# Patient Record
Sex: Female | Born: 1980 | Race: White | Hispanic: No | Marital: Single | Smoking: Current every day smoker
Health system: Southern US, Community
[De-identification: ages and names within clinical notes are randomized; demographics above are authoritative.]

---

## 2014-08-15 ENCOUNTER — Encounter (HOSPITAL_COMMUNITY): Payer: Self-pay | Admitting: Neurology

## 2014-08-15 ENCOUNTER — Emergency Department (HOSPITAL_COMMUNITY)
Admission: EM | Admit: 2014-08-15 | Discharge: 2014-08-16 | Disposition: A | Discharge status: Home / Self Care | Payer: Medicaid Other | Attending: Emergency Medicine | Admitting: Emergency Medicine

## 2014-08-15 ENCOUNTER — Emergency Department (HOSPITAL_COMMUNITY): Payer: Medicaid Other

## 2014-08-15 DIAGNOSIS — F10120 Alcohol abuse with intoxication, uncomplicated: Secondary | ICD-10-CM | POA: Diagnosis present

## 2014-08-15 DIAGNOSIS — R05 Cough: Secondary | ICD-10-CM | POA: Diagnosis not present

## 2014-08-15 DIAGNOSIS — Z79899 Other long term (current) drug therapy: Secondary | ICD-10-CM | POA: Diagnosis not present

## 2014-08-15 DIAGNOSIS — F10920 Alcohol use, unspecified with intoxication, uncomplicated: Secondary | ICD-10-CM

## 2014-08-15 DIAGNOSIS — R5383 Other fatigue: Secondary | ICD-10-CM | POA: Insufficient documentation

## 2014-08-15 DIAGNOSIS — R059 Cough, unspecified: Secondary | ICD-10-CM

## 2014-08-15 DIAGNOSIS — F1010 Alcohol abuse, uncomplicated: Secondary | ICD-10-CM

## 2014-08-15 DIAGNOSIS — Z3202 Encounter for pregnancy test, result negative: Secondary | ICD-10-CM | POA: Diagnosis not present

## 2014-08-15 LAB — URINALYSIS, ROUTINE W REFLEX MICROSCOPIC
Bilirubin Urine: NEGATIVE
Glucose, UA: NEGATIVE mg/dL
Ketones, ur: NEGATIVE mg/dL
LEUKOCYTES UA: NEGATIVE
Nitrite: NEGATIVE
PH: 6.5 (ref 5.0–8.0)
PROTEIN: 30 mg/dL — AB
Specific Gravity, Urine: 1.006 (ref 1.005–1.030)
UROBILINOGEN UA: 0.2 mg/dL (ref 0.0–1.0)

## 2014-08-15 LAB — CBC WITH DIFFERENTIAL/PLATELET
Basophils Absolute: 0 10*3/uL (ref 0.0–0.1)
Basophils Relative: 1 % (ref 0–1)
Eosinophils Absolute: 0.2 10*3/uL (ref 0.0–0.7)
Eosinophils Relative: 2 % (ref 0–5)
HEMATOCRIT: 45.1 % (ref 36.0–46.0)
Hemoglobin: 15.5 g/dL — ABNORMAL HIGH (ref 12.0–15.0)
Lymphocytes Relative: 39 % (ref 12–46)
Lymphs Abs: 3 10*3/uL (ref 0.7–4.0)
MCH: 37.6 pg — AB (ref 26.0–34.0)
MCHC: 34.4 g/dL (ref 30.0–36.0)
MCV: 109.5 fL — AB (ref 78.0–100.0)
MONO ABS: 0.4 10*3/uL (ref 0.1–1.0)
MONOS PCT: 5 % (ref 3–12)
Neutro Abs: 4.2 10*3/uL (ref 1.7–7.7)
Neutrophils Relative %: 53 % (ref 43–77)
Platelets: 149 10*3/uL — ABNORMAL LOW (ref 150–400)
RBC: 4.12 MIL/uL (ref 3.87–5.11)
RDW: 14.6 % (ref 11.5–15.5)
WBC: 7.8 10*3/uL (ref 4.0–10.5)

## 2014-08-15 LAB — COMPREHENSIVE METABOLIC PANEL
ALK PHOS: 79 U/L (ref 39–117)
ALT: 69 U/L — ABNORMAL HIGH (ref 0–35)
AST: 148 U/L — ABNORMAL HIGH (ref 0–37)
Albumin: 3.5 g/dL (ref 3.5–5.2)
Anion gap: 10 (ref 5–15)
BUN: 7 mg/dL (ref 6–23)
CO2: 28 mmol/L (ref 19–32)
Calcium: 8.2 mg/dL — ABNORMAL LOW (ref 8.4–10.5)
Chloride: 102 mEq/L (ref 96–112)
Creatinine, Ser: 0.75 mg/dL (ref 0.50–1.10)
GFR calc Af Amer: >90 mL/min (ref >90)
GFR calc non Af Amer: >90 mL/min (ref >90)
Glucose, Bld: 96 mg/dL (ref 70–99)
Potassium: 3.3 mmol/L — ABNORMAL LOW (ref 3.5–5.1)
SODIUM: 140 mmol/L (ref 135–145)
Total Bilirubin: 0.9 mg/dL (ref 0.3–1.2)
Total Protein: 6.9 g/dL (ref 6.0–8.3)

## 2014-08-15 LAB — RAPID URINE DRUG SCREEN, HOSP PERFORMED
Amphetamines: NOT DETECTED
BARBITURATES: NOT DETECTED
Benzodiazepines: NOT DETECTED
Cocaine: NOT DETECTED
Opiates: NOT DETECTED
Tetrahydrocannabinol: POSITIVE — AB

## 2014-08-15 LAB — URINE MICROSCOPIC-ADD ON

## 2014-08-15 LAB — SALICYLATE LEVEL

## 2014-08-15 LAB — PREGNANCY, URINE: Preg Test, Ur: NEGATIVE

## 2014-08-15 LAB — ETHANOL: Alcohol, Ethyl (B): 452 mg/dL (ref 0–9)

## 2014-08-15 MED ORDER — SODIUM CHLORIDE 0.9 % IV BOLUS (SEPSIS)
1000.0000 mL | Freq: Once | INTRAVENOUS | Status: AC
  Administered 2014-08-15: 1000 mL via INTRAVENOUS

## 2014-08-15 MED ORDER — VITAMIN B-1 100 MG PO TABS: 100.0000 mg | ORAL_TABLET | Freq: Every day | ORAL | Status: DC

## 2014-08-15 MED ORDER — LORAZEPAM 2 MG/ML IJ SOLN: 0.0000 mg | Freq: Two times a day (BID) | INTRAMUSCULAR | Status: DC

## 2014-08-15 MED ORDER — THIAMINE HCL 100 MG/ML IJ SOLN
100.0000 mg | Freq: Every day | INTRAMUSCULAR | Status: DC
  Administered 2014-08-15: 100 mg via INTRAVENOUS
  Filled 2014-08-15: qty 2

## 2014-08-15 MED ORDER — LORAZEPAM 2 MG/ML IJ SOLN
0.0000 mg | Freq: Four times a day (QID) | INTRAMUSCULAR | Status: DC
  Administered 2014-08-16: 1 mg via INTRAVENOUS
  Filled 2014-08-15: qty 1

## 2014-08-15 NOTE — ED Notes (Signed)
Pt states she has drink 1 fifth of vodka today, this is more than her normal. Family states she is planning on going to rehab for this and "she is going out with a big bang." Pt falling asleep in mid sentence. Pt voice is very raspy.

## 2014-08-15 NOTE — ED Notes (Signed)
CRITICAL VALUE ALERT  Critical value received:  ETOH 452  Date of notification:  08/15/14  Time of notification: 1950

## 2014-08-15 NOTE — ED Notes (Signed)
Pt reports here for rehab for ETOH. Pt appears very intoxicated, falling asleep in chair, unable to stay awake for triage.

## 2014-08-15 NOTE — ED Provider Notes (Signed)
CSN: [id number]     Arrival date & time 08/15/14  1736 History   First MD Initiated Contact with Patient 08/15/14 1747     Chief Complaint  Patient presents with  . Alcohol Intoxication   HPI  Patient is a 34 year old female who presents emergency room for evaluation of acute alcohol intoxication and desire for alcohol detoxification. Patient has been drinking near daily for the past 2 years. She drinks up to 1/5 of the vanilla flavored vodka per day. She drank one fifth today. She denies any physical symptoms at this time. She is acutely intoxicated and cannot stay awake during interview. Her best friend reports that she has also had a dry hacking cough with some intermittent yellow sputum production. She is also been very congested for the past 2 weeks. Patient did go and speak with the [facility] who recommended that she come here. Patient has never attempted to detox before. Patient does not have family in the area other than her daughter who is being taken care of by her best friend.  History reviewed. No pertinent past medical history. History reviewed. No pertinent past surgical history. No family history on file. History  Substance Use Topics  . Smoking status: Not on file  . Smokeless tobacco: Not on file  . Alcohol Use: Yes   OB History    No data available     Review of Systems Level 5 caveat alcohol intoxication   Allergies  Review of patient's allergies indicates no known allergies.  Home Medications   Prior to Admission medications   Medication Sig Start Date End Date Taking? Authorizing Provider  DULoxetine (CYMBALTA) 60 MG capsule Take 60 mg by mouth daily.   Yes Historical Provider, MD  furosemide (LASIX) 20 MG tablet Take 20 mg by mouth daily.    Yes Historical Provider, MD  gabapentin (NEURONTIN) 100 MG capsule Take 100 mg by mouth 2 (two) times daily.   Yes Historical Provider, MD  HYDROcodone-acetaminophen (NORCO) 10-325 MG per tablet Take 1 tablet by mouth 2  (two) times daily.    Yes Historical Provider, MD  PRESCRIPTION MEDICATION Take 1 tablet by mouth daily. BP med?   Yes Historical Provider, MD   BP 93/66 mmHg  Pulse 68  Temp(Src) 98.6 F (37 C) (Oral)  Resp 20  SpO2 92%  LMP  (LMP Unknown) Physical Exam  Constitutional: She appears well-developed and well-nourished. She appears lethargic. No distress.  HENT:  Head: Normocephalic and atraumatic.  Mouth/Throat: Oropharynx is clear and moist. No oropharyngeal exudate.  Eyes: Conjunctivae and EOM are normal. Pupils are equal, round, and reactive to light. No scleral icterus.  Neck: Normal range of motion. Neck supple. No JVD present. No thyromegaly present.  Cardiovascular: Normal rate, regular rhythm, normal heart sounds and intact distal pulses.  Exam reveals no gallop and no friction rub.   No murmur heard. Pulmonary/Chest: Effort normal and breath sounds normal. No respiratory distress. She has no wheezes. She has no rales. She exhibits no tenderness.  Abdominal: Soft. Bowel sounds are normal. She exhibits no distension and no mass. There is no tenderness. There is no rebound and no guarding.  Musculoskeletal: Normal range of motion.  Lymphadenopathy:    She has no cervical adenopathy.  Neurological: She has normal strength. She appears lethargic. No cranial nerve deficit or sensory deficit.  Patient lethargic but will wake up with sternal rub and voice commands. Patient follows commands appropriately. She was all extremities. She is oriented to person and  place.  Skin: Skin is warm and dry. She is not diaphoretic.  Nursing note and vitals reviewed.   ED Course  Procedures (including critical care time) Labs Review Labs Reviewed  CBC WITH DIFFERENTIAL - Abnormal; Notable for the following:    Hemoglobin 15.5 (*)    MCV 109.5 (*)    MCH 37.6 (*)    Platelets 149 (*)    All other components within normal limits  COMPREHENSIVE METABOLIC PANEL - Abnormal; Notable for the  following:    Potassium 3.3 (*)    Calcium 8.2 (*)    AST 148 (*)    ALT 69 (*)    All other components within normal limits  ETHANOL - Abnormal; Notable for the following:    Alcohol, Ethyl (B) 452 (*)    All other components within normal limits  URINE RAPID DRUG SCREEN (HOSP PERFORMED) - Abnormal; Notable for the following:    Tetrahydrocannabinol POSITIVE (*)    All other components within normal limits  URINALYSIS, ROUTINE W REFLEX MICROSCOPIC - Abnormal; Notable for the following:    Hgb urine dipstick SMALL (*)    Protein, ur 30 (*)    All other components within normal limits  SALICYLATE LEVEL  PREGNANCY, URINE  URINE MICROSCOPIC-ADD ON    Imaging Review Dg Chest 2 View  08/15/2014   CLINICAL DATA:  Cough and weakness for 3 days  EXAM: CHEST  2 VIEW  COMPARISON:  None.  FINDINGS: Cardiac shadow is mildly enlarged. The lungs are well aerated bilaterally. No focal infiltrate or sizable effusion is seen. Mild increased perihilar markings are noted. This may be related to mild vascular congestion or underlying bronchitic changes. No acute bony abnormality is noted.  IMPRESSION: Mild increased central markings as described. No focal confluent infiltrate is seen.   Electronically Signed   By: Alcide CleverMark  Lukens M.D.   On: 08/15/2014 19:49     EKG Interpretation None      MDM   Final diagnoses:  Cough  Alcohol abuse  Alcohol intoxication, uncomplicated   Patient is a 34 year old female who presents emergency room for acute alcohol intoxication. Her friend reports that she has a desire to detox. Patient is able to be aroused with verbal commands, and will follow verbal commands. She is protecting her airway. Vital signs are stable. CBC reveals macrocytic red blood cells with no acute anemia. CMP reveals mild hypokalemia which have been replaced here. Salicylate is negative. Urine drug screen is positive only for THC. Urine pregnancy is negative. Alcohol level is 453. Patient is  acutely intoxicated. Patient given normal saline bolus. She has been placed on CIWA protocol. We'll start her on regular diet as she sobers up. We'll reevaluate desire for alcohol detoxification when she is sober. Patient to be held until she has sobered up and can hold a coherent conversation and walk with stable gait. Patient to be signed out with Dr. Micheline Mazeocherty.    Eben Burowourtney A Forcucci, PA-C 08/16/14 [id number]  Toy CookeyMegan Docherty, MD 08/16/14 [id number]

## 2014-08-16 MED ORDER — CHLORDIAZEPOXIDE HCL 25 MG PO CAPS: 50.0000 mg | ORAL_CAPSULE | Freq: Three times a day (TID) | ORAL | Status: DC | PRN

## 2014-08-16 NOTE — Discharge Instructions (Signed)
Alcohol Withdrawal °Alcohol withdrawal happens when you normally drink alcohol a lot and suddenly stop drinking. Alcohol withdrawal symptoms can be mild to very bad. Mild withdrawal symptoms can include feeling sick to your stomach (nauseous), headache, or feeling irritable. Bad withdrawal symptoms can include shakiness, being very nervous (anxious), and not thinking clearly.  °HOME CARE °· Join an alcohol support group. °· Stay away from people or situations that make you want to drink. °· Eat a healthy diet. Eat a lot of fresh fruits, vegetables, and lean meats. °GET HELP RIGHT AWAY IF:  °· You become confused. You start to see and hear things that are not really there. °· You feel your heart beating very fast. °· You throw up (vomit) blood or cannot stop throwing up. This may be bright red or look like black coffee grounds. °· You have blood in your poop (stool). This may be bright red, maroon colored, or black and tarry. °· You are lightheaded or pass out (faint). °· You develop a fever. °MAKE SURE YOU:  °· Understand these instructions. °· Will watch your condition. °· Will get help right away if you are not doing well or get worse. °Document Released: 12/31/2007 Document Revised: 10/06/2011 Document Reviewed: 12/31/2007 °ExitCare® Patient Information ©2015 [employer]. This information is not intended to replace advice given to you by your health care provider. Make sure you discuss any questions you have with your health care provider. ° °Alcohol Use Disorder °Alcohol use disorder is a mental disorder. It is not a one-time incident of heavy drinking. Alcohol use disorder is the excessive and uncontrollable use of alcohol over time that leads to problems with functioning in one or more areas of daily living. People with this disorder risk harming themselves and others when they drink to excess. Alcohol use disorder also can cause other mental disorders, such as mood and anxiety disorders, and serious  physical problems. People with alcohol use disorder often misuse other drugs.  °Alcohol use disorder is common and widespread. Some people with this disorder drink alcohol to cope with or escape from negative life events. Others drink to relieve chronic pain or symptoms of mental illness. People with a family history of alcohol use disorder are at higher risk of losing control and using alcohol to excess.  °SYMPTOMS  °Signs and symptoms of alcohol use disorder may include the following:  °· Consumption of alcohol in larger amounts or over a longer period of time than intended. °· Multiple unsuccessful attempts to cut down or control alcohol use.   °· A great deal of time spent obtaining alcohol, using alcohol, or recovering from the effects of alcohol (hangover). °· A strong desire or urge to use alcohol (cravings).   °· Continued use of alcohol despite problems at work, school, or home because of alcohol use.   °· Continued use of alcohol despite problems in relationships because of alcohol use. °· Continued use of alcohol in situations when it is physically hazardous, such as driving a car. °· Continued use of alcohol despite awareness of a physical or psychological problem that is likely related to alcohol use. Physical problems related to alcohol use can involve the brain, heart, liver, stomach, and intestines. Psychological problems related to alcohol use include intoxication, depression, anxiety, psychosis, delirium, and dementia.   °· The need for increased amounts of alcohol to achieve the same desired effect, or a decreased effect from the consumption of the same amount of alcohol (tolerance). °· Withdrawal symptoms upon reducing or stopping alcohol use, or alcohol use   to reduce or avoid withdrawal symptoms. Withdrawal symptoms include: °¨ Racing heart. °¨ Hand tremor. °¨ Difficulty sleeping. °¨ Nausea. °¨ Vomiting. °¨ Hallucinations. °¨ Restlessness. °¨ Seizures. °DIAGNOSIS °Alcohol use disorder is  diagnosed through an assessment by your health care provider. Your health care provider may start by asking three or four questions to screen for excessive or problematic alcohol use. To confirm a diagnosis of alcohol use disorder, at least two symptoms must be present within a 12-month period. The severity of alcohol use disorder depends on the number of symptoms: °· Mild--two or three. °· Moderate--four or five. °· Severe--six or more. °Your health care provider may perform a physical exam or use results from lab tests to see if you have physical problems resulting from alcohol use. Your health care provider may refer you to a mental health professional for evaluation. °TREATMENT  °Some people with alcohol use disorder are able to reduce their alcohol use to low-risk levels. Some people with alcohol use disorder need to quit drinking alcohol. When necessary, mental health professionals with specialized training in substance use treatment can help. Your health care provider can help you decide how severe your alcohol use disorder is and what type of treatment you need. The following forms of treatment are available:  °· Detoxification. Detoxification involves the use of prescription medicines to prevent alcohol withdrawal symptoms in the first week after quitting. This is important for people with a history of symptoms of withdrawal and for heavy drinkers who are likely to have withdrawal symptoms. Alcohol withdrawal can be dangerous and, in severe cases, cause death. Detoxification is usually provided in a hospital or in-patient substance use treatment facility. °· Counseling or talk therapy. Talk therapy is provided by substance use treatment counselors. It addresses the reasons people use alcohol and ways to keep them from drinking again. The goals of talk therapy are to help people with alcohol use disorder find healthy activities and ways to cope with life stress, to identify and avoid triggers for alcohol  use, and to handle cravings, which can cause relapse. °· Medicines. Different medicines can help treat alcohol use disorder through the following actions: °¨ Decrease alcohol cravings. °¨ Decrease the positive reward response felt from alcohol use. °¨ Produce an uncomfortable physical reaction when alcohol is used (aversion therapy). °· Support groups. Support groups are run by people who have quit drinking. They provide emotional support, advice, and guidance. °These forms of treatment are often combined. Some people with alcohol use disorder benefit from intensive combination treatment provided by specialized substance use treatment centers. Both inpatient and outpatient treatment programs are available. °Document Released: 08/21/2004 Document Revised: 11/28/2013 Document Reviewed: 10/21/2012 °ExitCare® Patient Information ©2015 [employer]. This information is not intended to replace advice given to you by your health care provider. Make sure you discuss any questions you have with your health care provider. ° ° ° °Emergency Department Resource Guide °1) Find a Doctor and Pay Out of Pocket °Although you won't have to find out who is covered by your insurance plan, it is a good idea to ask around and get recommendations. You will then need to call the office and see if the doctor you have chosen will accept you as a new patient and what types of options they offer for patients who are self-pay. Some doctors offer discounts or will set up payment plans for their patients who do not have insurance, but you will need to ask so you aren't surprised when you get to your   appointment. ° °2) Contact Your Local Health Department °Not all health departments have doctors that can see patients for sick visits, but many do, so it is worth a call to see if yours does. If you don't know where your local health department is, you can check in your phone book. The CDC also has a tool to help you locate your state's health  department, and many state websites also have listings of all of their local health departments. ° °3) Find a Walk-in Clinic °If your illness is not likely to be very severe or complicated, you may want to try a walk in clinic. These are popping up all over the country in pharmacies, drugstores, and shopping centers. They're usually staffed by nurse practitioners or physician assistants that have been trained to treat common illnesses and complaints. They're usually fairly quick and inexpensive. However, if you have serious medical issues or chronic medical problems, these are probably not your best option. ° °No Primary Care Doctor: °- Call [facility] at  832-8000 - they can help you locate a primary care doctor that  accepts your insurance, provides certain services, etc. °- Physician Referral Service- 1-800-533-3463 ° °Chronic Pain Problems: °Organization         Address  Phone   Notes  °[facility]  (336) 297-2271 Patients need to be referred by their primary care doctor.  ° °Medication Assistance: °Organization         Address  Phone   Notes  °[place] [facility] [address] °[place], [place] [address] (336) 641-8030 --Must be a resident of [place] °-- Must have NO insurance coverage whatsoever (no Medicaid/ Medicare, etc.) °-- The pt. MUST have a primary care doctor that directs their care regularly and follows them in the community °  °[facility]  (866) 331-1348   °[employer]  (888) 892-1162   ° °Agencies that provide inexpensive medical care: °Organization         Address  Phone   Notes  °[facility]  (336) 832-8035   ° Internal Medicine    (336) 832-7272   °[facility] [address] °[place], [place] [address] (336) 832-4777   °[facility] [address], °[place] (336) 271-4999   °[facility]    (336) 373-0678   °[facility]    (336) 272-1050    °[facility] ° [address], [place] Phone:  (336) 832-4444, Fax:  (336) 832-4440 Hours of Operation:  9 am - 6 pm, M-F.  Also accepts Medicaid/Medicare and self-pay.  °[facility] ° [address], [place] Phone: (336) 832-3150, Fax: (336) [phone]. Hours of Operation:  8:30 am - 5:30 pm, M-F.  Also accepts Medicaid and self-pay.  °[facility] [place] [address], [place] Phone: (336) 878-6027   °[facility] [address], [place], [place] (336)723-1848, Ext. 123 Mondays & Thursdays: 7-9 AM.  First 15 patients are seen on a first come, first serve basis. °  ° °Medicaid-accepting [place] Providers: ° °Organization         Address  Phone   Notes  °[facility] [address], [place] (336) 641-2100 Also accepts self-pay patients.  °[facility] [address],  ° (336) 856-9996   °[facility] [address], [place] (336) 288-8857   °[facility] [address] [place] [address], [place] (336) 299-7000   °Veita Bland [address], [place]  ° (336) 373-1557 Only accepts [place] Access Medicaid patients after they have their name applied to their card.  ° °Self-Pay (no insurance) in [place]: ° °Organization         Address  Phone   Notes  °Sickle Cell Patients, [facility] [address], [place] (336) 832-1970   °[facility] [address], [place] (336) 832-4400   °[facility] ° [address], [place] (336) 992-4800   °[facility]/Dr. Osei-Bonsu ° [address] [place] [address], [place] or [address], [place] (336) [phone] Phone number for both [place] and [place] locations is the same.  °[facility] [address], [place] (336) 299-0000    °[facility] [address] [place] [address], [place] or [address] (336) 852-7530 °(336) 878-2260   °[facility] [address], [place] (336) 350-1642, phone; (336) 294-5005, fax Sees patients 1st and 3rd Saturday of every month.  Must not qualify for public or private insurance (i.e. Medicaid, Medicare, [place] Health Choice, Veterans' Benefits) • Household income should be no more than 200% of the poverty level •The clinic cannot treat you if you are pregnant or think you are pregnant • Sexually transmitted diseases are not treated at the clinic.  ° ° °Dental Care: °Organization         Address  Phone  Notes  °[facility] [address], [place] (336) 641-6152 Accepts children up to age 21 who are enrolled in Medicaid or [place] Health Choice; pregnant women with a Medicaid card; and children who have applied for Medicaid or [place] Health Choice, but were declined, whose parents can pay a reduced fee at time of service.  °[facility] [place]  [address], [place] (336) 641-7733 Accepts children up to age 21 who are enrolled in Medicaid or [place] Health Choice; pregnant women with a Medicaid card; and children who have applied for Medicaid or [place] Health Choice, but were declined, whose parents can pay a reduced fee at time of service.  °[facility] ° [address], [place] (336) 641-4533 Patients are seen by appointment only. Walk-ins are not accepted. [facility] will see patients 18 years of age and older. °Monday - Tuesday (8am-5pm) °Most Wednesdays (8:30-5pm) °$30 per visit, cash only  °[facility] ° [address], [place] (336) 641-4533 Patients are seen by appointment only. Walk-ins are not accepted. [facility] will see patients 18 years of age and older. °One Wednesday Evening (Monthly: Volunteer Based).   $30 per visit, cash only  °[facility]  (919) [phone] for adults; Children under age 4, call Graduate Pediatric Dentistry at ([phone]) [phone]. Children aged 4-14, please call ([phone]) [phone] to request a pediatric application. ° Dental services are provided in all areas of dental care including fillings, crowns and bridges, complete and partial dentures, implants, gum treatment, root canals, and extractions. Preventive care is also provided. Treatment is provided to both adults and children. °Patients are selected via a lottery and there is often a waiting list. °  °[facility] [address], °[place] ° (336) 763-8833 [email or web address] °  °[facility] [address], [place], [place] (336)723-1848, Ext. 123 Second and Fourth Thursday of each month, opens at 6:30 AM; Clinic ends at 9 AM.  Patients are seen on a first-come first-served basis, and a limited number are seen during each clinic.  ° °[facility] ° [address], [place], [place] (336) 723-7904   Eligibility Requirements °You must have lived in [place], [place], or [place] counties for at least the last three months. °  You cannot be eligible for state or federal sponsored healthcare insurance, including Veterans Administration, Medicaid, or Medicare. °  You generally cannot be eligible for healthcare insurance through your employer.  °  How to apply: °Eligibility screenings are held every Tuesday and Wednesday afternoon from 1:00 pm until 4:00 pm. You do not need an appointment for the interview!  °[facility] [address], [place], [place] 336-631-2330   °[facility]  336-342-8273   °[facility]  336-703-3100   °[facility]  336-570-6415   ° °Behavioral Health Resources in the Community: °Intensive Outpatient Programs °Organization         Address  Phone  Notes  °[facility] [address], [place], [place] 336-878-6098   °[facility] [address], [place], [place] 336-832-9800   °[facility] [address], [place], [place] ° 336-882-2125   °[facility] [address],  °[place], [place] 1-800-853-5163 or 336-641-4981   °Substance Abuse Resources °Organization         Address  Phone  Notes  °[facility]  336-882-2125   °[facility]  336-784-9470   °[facility] [place] [facility]  336-285-9073   °[facility]  336-845-3988   °Residential & Outpatient Substance Abuse Program  1-800-659-3381   °Psychological Services °Organization         Address  Phone  Notes  °[facility]  336- 832-9600   °[facility]  336- 378-7881   °[facility] [address], [place] 1-800-853-5163 or 336-641-4981   ° °Mobile Crisis Teams °Organization         Address  Phone  Notes  °[facility], Mobile Crisis Care Unit  1-877-626-1772   °Assertive °[facility] ° [address] [place], [place] 336-834-9664   °Sharon DeEsch [address] °[place] 336-554-5454   ° °Self-Help/Support Groups °Organization         Address  Phone             Notes  °[facility] - variety of support groups  336- 373-1402 Call for more information  °Narcotics Anonymous (NA), [facility] [address], °[place]  2 meetings at this location  ° °Residential Treatment Programs °Organization         Address  Phone  Notes  °[facility] [address],    °[place]  1-866-801-8205   °[facility] ° [address], [place], [place] 704-293-8524   °[facility] [address], [place] 336-845-3988 Admissions: 8am-3pm M-F  °[facility] [address]    °[place], [place] 336-841-1104   °[facility] [address], [place],  336-379-7146   °[facility] [place]  [facility] [address]  °[place],  336-285-9073   °  [facility] - Intensive Outpatient [address], [place], [place] 336-852-3033   °[facility] [address]  °[place], [place] 1-877-615-2722 or 336-784-9470   °[facility] [address] [place], [place] 336-227-7417 Accepts Medicaid  °[facility] [address]  °[place] 1-800-659-3381 Substance Abuse/Addiction Treatment  ° °[place] Behavioral Health Resources °Organization         Address  Phone  Notes  °[facility]  (888) 581-9988   °Julie Brannon, PhD [address] [place], [place]   (336) 349-5553 or (336) 951-0000   °[facility]   [address] °[place], [place] (336) 349-4454   °[facility] [address], [place], [place] (336) 342-8316 Insurance/Medicaid/sponsorship through [facility]  °[facility] [address]                                    [place], [place] (336) 342-8316 Therapy/tele-psych/case  °[facility] [address]  ° [place], [place] (336) 349-2233    °Dr. Arfeen  (336) 349-4544   °[facility] [place]  [employer] [place] [facility] 1) [address],  °2) [address], [place] °3)  [address], [place] (336) 349-3220 °(336) 342-7768 ° °(336) 342-8140   °[place] [facility] (336) 342-1394 or (336) 342-3537 (After Hours)    ° ° ° °

## 2014-08-16 NOTE — ED Notes (Signed)
Clean pt.up changed linen.place into clean gown.gave warm blankets.

## 2014-08-27 LAB — CBC
HCT: 43.5 % (ref 35.0–47.0)
HGB: 14.5 g/dL (ref 12.0–16.0)
MCH: 37.1 pg — ABNORMAL HIGH (ref 26.0–34.0)
MCHC: 33.4 g/dL (ref 32.0–36.0)
MCV: 111 fL — ABNORMAL HIGH (ref 80–100)
PLATELETS: 142 10*3/uL — AB (ref 150–440)
RBC: 3.91 10*6/uL (ref 3.80–5.20)
RDW: 16.7 % — ABNORMAL HIGH (ref 11.5–14.5)
WBC: 7.5 10*3/uL (ref 3.6–11.0)

## 2014-08-27 LAB — COMPREHENSIVE METABOLIC PANEL
ALK PHOS: 89 U/L (ref 46–116)
ALT: 117 U/L — AB (ref 14–63)
AST: 213 U/L — AB (ref 15–37)
Albumin: 3.2 g/dL — ABNORMAL LOW (ref 3.4–5.0)
Anion Gap: 16 (ref 7–16)
BUN: 27 mg/dL — ABNORMAL HIGH (ref 7–18)
Bilirubin,Total: 1.1 mg/dL — ABNORMAL HIGH (ref 0.2–1.0)
CALCIUM: 7.6 mg/dL — AB (ref 8.5–10.1)
CO2: 26 mmol/L (ref 21–32)
CREATININE: 2.36 mg/dL — AB (ref 0.60–1.30)
Chloride: 90 mmol/L — ABNORMAL LOW (ref 98–107)
GFR CALC AF AMER: 31 — AB
GFR CALC NON AF AMER: 25 — AB
GLUCOSE: 74 mg/dL (ref 65–99)
Osmolality: 268 (ref 275–301)
Potassium: 3 mmol/L — ABNORMAL LOW (ref 3.5–5.1)
Sodium: 132 mmol/L — ABNORMAL LOW (ref 136–145)
Total Protein: 6.8 g/dL (ref 6.4–8.2)

## 2014-08-27 LAB — ETHANOL: ETHANOL LVL: 440 mg/dL — AB

## 2014-08-27 LAB — ACETAMINOPHEN LEVEL: Acetaminophen: <2 ug/mL

## 2014-08-27 LAB — SALICYLATE LEVEL: Salicylates, Serum: 2.6 mg/dL

## 2014-08-28 LAB — CBC WITH DIFFERENTIAL/PLATELET
Basophil #: 0 10*3/uL (ref 0.0–0.1)
Basophil %: 1.3 %
EOS ABS: 0 10*3/uL (ref 0.0–0.7)
Eosinophil %: 0.3 %
HCT: 42.7 % (ref 35.0–47.0)
HGB: 14.4 g/dL (ref 12.0–16.0)
LYMPHS ABS: 0.8 10*3/uL — AB (ref 1.0–3.6)
Lymphocyte %: 22.7 %
MCH: 37.8 pg — ABNORMAL HIGH (ref 26.0–34.0)
MCHC: 33.9 g/dL (ref 32.0–36.0)
MCV: 112 fL — AB (ref 80–100)
MONO ABS: 0.4 x10 3/mm (ref 0.2–0.9)
Monocyte %: 13.4 %
Neutrophil #: 2.1 10*3/uL (ref 1.4–6.5)
Neutrophil %: 62.3 %
PLATELETS: 103 10*3/uL — AB (ref 150–440)
RBC: 3.82 10*6/uL (ref 3.80–5.20)
RDW: 17 % — AB (ref 11.5–14.5)
WBC: 3.3 10*3/uL — ABNORMAL LOW (ref 3.6–11.0)

## 2014-08-28 LAB — BASIC METABOLIC PANEL
Anion Gap: 16 (ref 7–16)
Anion Gap: 10 (ref 7–16)
BUN: 28 mg/dL — ABNORMAL HIGH (ref 7–18)
BUN: 29 mg/dL — ABNORMAL HIGH (ref 7–18)
CALCIUM: 7.8 mg/dL — AB (ref 8.5–10.1)
CHLORIDE: 94 mmol/L — AB (ref 98–107)
CHLORIDE: 92 mmol/L — AB (ref 98–107)
CREATININE: 1.19 mg/dL (ref 0.60–1.30)
Calcium, Total: 8.7 mg/dL (ref 8.5–10.1)
Co2: 27 mmol/L (ref 21–32)
Co2: 30 mmol/L (ref 21–32)
Creatinine: 1.17 mg/dL (ref 0.60–1.30)
EGFR (African American): >60
EGFR (Non-African Amer.): 57 — ABNORMAL LOW
EGFR (Non-African Amer.): 56 — ABNORMAL LOW
Glucose: 64 mg/dL — ABNORMAL LOW (ref 65–99)
Glucose: 138 mg/dL — ABNORMAL HIGH (ref 65–99)
OSMOLALITY: 277 (ref 275–301)
Osmolality: 273 (ref 275–301)
POTASSIUM: 3.2 mmol/L — AB (ref 3.5–5.1)
Potassium: 3.3 mmol/L — ABNORMAL LOW (ref 3.5–5.1)
SODIUM: 137 mmol/L (ref 136–145)
SODIUM: 132 mmol/L — AB (ref 136–145)

## 2014-08-29 ENCOUNTER — Inpatient Hospital Stay: Payer: Self-pay | Admitting: Internal Medicine

## 2014-08-29 LAB — CBC WITH DIFFERENTIAL/PLATELET
BASOS ABS: 0 10*3/uL (ref 0.0–0.1)
BASOS PCT: 0.7 %
Eosinophil #: 0.1 10*3/uL (ref 0.0–0.7)
Eosinophil %: 1.3 %
HCT: 43 % (ref 35.0–47.0)
HGB: 14.2 g/dL (ref 12.0–16.0)
Lymphocyte #: 1.1 10*3/uL (ref 1.0–3.6)
Lymphocyte %: 21.5 %
MCH: 37.4 pg — AB (ref 26.0–34.0)
MCHC: 33 g/dL (ref 32.0–36.0)
MCV: 113 fL — ABNORMAL HIGH (ref 80–100)
Monocyte #: 0.4 x10 3/mm (ref 0.2–0.9)
Monocyte %: 7.9 %
NEUTROS ABS: 3.5 10*3/uL (ref 1.4–6.5)
Neutrophil %: 68.6 %
Platelet: 96 10*3/uL — ABNORMAL LOW (ref 150–440)
RBC: 3.79 10*6/uL — ABNORMAL LOW (ref 3.80–5.20)
RDW: 16.7 % — ABNORMAL HIGH (ref 11.5–14.5)
WBC: 5.1 10*3/uL (ref 3.6–11.0)

## 2014-08-29 LAB — D-DIMER(ARMC): D-Dimer: 4032 ng/ml

## 2014-08-29 LAB — BASIC METABOLIC PANEL
Anion Gap: 12 (ref 7–16)
BUN: 22 mg/dL — ABNORMAL HIGH (ref 7–18)
CALCIUM: 8.1 mg/dL — AB (ref 8.5–10.1)
Chloride: 94 mmol/L — ABNORMAL LOW (ref 98–107)
Co2: 27 mmol/L (ref 21–32)
Creatinine: 0.88 mg/dL (ref 0.60–1.30)
EGFR (Non-African Amer.): >60
Glucose: 116 mg/dL — ABNORMAL HIGH (ref 65–99)
Osmolality: 271 (ref 275–301)
Potassium: 3 mmol/L — ABNORMAL LOW (ref 3.5–5.1)
Sodium: 133 mmol/L — ABNORMAL LOW (ref 136–145)

## 2014-08-29 LAB — RAPID HIV SCREEN (HIV 1/2 AB+AG)

## 2014-08-29 LAB — MAGNESIUM: MAGNESIUM: 1.9 mg/dL

## 2014-08-29 LAB — TROPONIN I: Troponin-I: <0.02 ng/mL

## 2014-08-29 LAB — ETHANOL

## 2014-08-29 LAB — PROTIME-INR
INR: 0.9
Prothrombin Time: 12.2 secs

## 2014-08-29 LAB — CLOSTRIDIUM DIFFICILE(ARMC)

## 2014-08-29 LAB — APTT: Activated PTT: 30.2 secs (ref 23.6–35.9)

## 2014-08-30 LAB — BASIC METABOLIC PANEL
ANION GAP: 7 (ref 7–16)
BUN: 8 mg/dL (ref 7–18)
Calcium, Total: 8.7 mg/dL (ref 8.5–10.1)
Chloride: 97 mmol/L — ABNORMAL LOW (ref 98–107)
Co2: 34 mmol/L — ABNORMAL HIGH (ref 21–32)
Creatinine: 0.72 mg/dL (ref 0.60–1.30)
EGFR (African American): >60
EGFR (Non-African Amer.): >60
Glucose: 90 mg/dL (ref 65–99)
OSMOLALITY: 274 (ref 275–301)
Potassium: 3.2 mmol/L — ABNORMAL LOW (ref 3.5–5.1)
Sodium: 138 mmol/L (ref 136–145)

## 2014-08-30 LAB — CBC WITH DIFFERENTIAL/PLATELET
BASOS ABS: 0 10*3/uL (ref 0.0–0.1)
Basophil %: 0.7 %
EOS PCT: 1.8 %
Eosinophil #: 0.1 10*3/uL (ref 0.0–0.7)
HCT: 39.2 % (ref 35.0–47.0)
HGB: 13 g/dL (ref 12.0–16.0)
LYMPHS PCT: 22.5 %
Lymphocyte #: 1.1 10*3/uL (ref 1.0–3.6)
MCH: 37.5 pg — AB (ref 26.0–34.0)
MCHC: 33.3 g/dL (ref 32.0–36.0)
MCV: 113 fL — ABNORMAL HIGH (ref 80–100)
MONOS PCT: 9.2 %
Monocyte #: 0.5 x10 3/mm (ref 0.2–0.9)
NEUTROS PCT: 65.8 %
Neutrophil #: 3.3 10*3/uL (ref 1.4–6.5)
Platelet: 86 10*3/uL — ABNORMAL LOW (ref 150–440)
RBC: 3.48 10*6/uL — ABNORMAL LOW (ref 3.80–5.20)
RDW: 16.5 % — AB (ref 11.5–14.5)
WBC: 5 10*3/uL (ref 3.6–11.0)

## 2014-08-30 LAB — T4, FREE: Free Thyroxine: 1.22 ng/dL (ref 0.76–1.46)

## 2014-08-30 LAB — HEMOGLOBIN A1C: HEMOGLOBIN A1C: 5.5 % (ref 4.2–6.3)

## 2014-08-30 LAB — TSH: Thyroid Stimulating Horm: 5.35 u[IU]/mL — ABNORMAL HIGH

## 2014-08-30 LAB — MAGNESIUM: Magnesium: 1.7 mg/dL — ABNORMAL LOW

## 2014-08-31 LAB — BETA STREP CULTURE(ARMC)

## 2014-08-31 LAB — POTASSIUM: Potassium: 3.1 mmol/L — ABNORMAL LOW (ref 3.5–5.1)

## 2014-08-31 LAB — MAGNESIUM: Magnesium: 1.7 mg/dL — ABNORMAL LOW

## 2014-08-31 LAB — STOOL CULTURE

## 2014-09-01 LAB — CBC WITH DIFFERENTIAL/PLATELET
BASOS ABS: 0 10*3/uL (ref 0.0–0.1)
Basophil %: 1.1 %
Eosinophil #: 0.1 10*3/uL (ref 0.0–0.7)
Eosinophil %: 1.9 %
HCT: 37.9 % (ref 35.0–47.0)
HGB: 13.1 g/dL (ref 12.0–16.0)
LYMPHS ABS: 1.4 10*3/uL (ref 1.0–3.6)
Lymphocyte %: 33.9 %
MCH: 38.5 pg — ABNORMAL HIGH (ref 26.0–34.0)
MCHC: 34.6 g/dL (ref 32.0–36.0)
MCV: 111 fL — AB (ref 80–100)
Monocyte #: 0.5 x10 3/mm (ref 0.2–0.9)
Monocyte %: 11.3 %
NEUTROS PCT: 51.8 %
Neutrophil #: 2.1 10*3/uL (ref 1.4–6.5)
PLATELETS: 140 10*3/uL — AB (ref 150–440)
RBC: 3.41 10*6/uL — AB (ref 3.80–5.20)
RDW: 16.6 % — ABNORMAL HIGH (ref 11.5–14.5)
WBC: 4 10*3/uL (ref 3.6–11.0)

## 2014-09-01 LAB — BASIC METABOLIC PANEL
ANION GAP: 6 — AB (ref 7–16)
BUN: 3 mg/dL — ABNORMAL LOW (ref 7–18)
CALCIUM: 9.1 mg/dL (ref 8.5–10.1)
Chloride: 99 mmol/L (ref 98–107)
Co2: 35 mmol/L — ABNORMAL HIGH (ref 21–32)
Creatinine: 0.78 mg/dL (ref 0.60–1.30)
EGFR (African American): >60
EGFR (Non-African Amer.): >60
Glucose: 89 mg/dL (ref 65–99)
Osmolality: 275 (ref 275–301)
Potassium: 3.5 mmol/L (ref 3.5–5.1)
Sodium: 140 mmol/L (ref 136–145)

## 2014-09-03 LAB — CULTURE, BLOOD (SINGLE)

## 2014-11-26 NOTE — Discharge Summary (Signed)
PATIENT NAME:  Stacey Hunter, Stacey Hunter MR#:  [id number] DATE OF BIRTH:  04-02-1981  DATE OF ADMISSION:  08/29/2014 DATE OF DISCHARGE:  09/02/2014  DISCHARGE DIAGNOSES: 1. Hypoxia secondary to atypical pneumonia.  2. Clostridium difficile colitis.  3. Depression.  4. Tobacco abuse.  5. Neuropathy.  6. Hypertension.  7. The patient also has history of chronic obstructive pulmonary disease.   DISCHARGE MEDICATIONS: 1. Furosemide 20 mg p.o. daily.  2. Duloxetine 60 mg daily.  3. Hydrochlorothiazide/lisinopril 25/20 mg p.o. daily.  4. Neurontin 300 mg p.o. t.i.d.  5. ProAir 90 mcg 2 puffs every 6 hours.  6. Atenolol 50 mg p.o. daily.  7. Flagyl 500 mg every 8 hours for 14 days, 42 tablets are prescribed.  8. Spiriva 18 mcg inhalation daily.   The patient advised to stop Percocet.   DIET: Low-sodium diet.   CONSULTATIONS: Stann Mainland. Sampson Goon, MD from ID.  HOSPITAL COURSE: This is a 34 year old female patient with hypertension, morbid obesity, history of alcohol abuse, tobacco abuse, comes in because of shortness of breath. The patient has a heavy smoking history, smoked about a pack per day for 15-20 years. The patient started to have cough for about 2 months, but did not have any fever or chills or night sweats or weight loss. No history of tuberculosis contacts. The patient's CT of the chest showed no pulmonary embolus, but had diffuse reticular interstitial pattern, which was likely a miliary pattern, concerning for tuberculosis, so she was admitted to respiratory isolation. The patient was started on IV Rocephin and Zithromax for pneumonia, and Dr. Sampson Goon has been contacted for possible tuberculosis. The patient was seen by Dr. Sampson Goon. Her HIV test has been negative. She is unable to give [place] sputum samples, and she has no cough at all, so Dr. Sampson Goon said she is less likely to have tuberculosis, given her lack of symptoms, and her symptoms are consistent mostly with pneumonia. The  patient received 5 days of Rocephin and Zithromax and finished a course of antibiotics for pneumonia. The patient developed diarrhea. She had several stools Clostridium difficile positive, so Dr. Sampson Goon recommended  Flagyl to be given for 14 days. The patient's symptoms of hoarseness, cough improved nicely with antibiotics. The patient's airborne isolation is removed because Dr. Sampson Goon recommended that she having tuberculosis is very low, and she is not able to have any sputum. The patient will see Dr. Sampson Goon in the office, and we also sent a Histoplasma antigen, as well, but the results are pending. The patient's oxygen saturations improved. Her oxygen saturations on admission were 96 on 2 liters, but in between, she had 90% on 2 liters. But today. She is 95 on room air, and her lungs are much clearer, and she felt much better. She has no fever. The patient's sputum cultures grew a light growth of group B streptococcus. She had group B streptococcus from sputum cultures on February 2nd. She understands that she will follow with Dr. Sampson Goon and will continue to take Flagyl. I counseled against smoking and drinking strongly, and patient was started Spiriva, but she does have ProAir , so I told her to continue. 2. Regarding high blood pressure, she is on hydrochlorothiazide with Lisinopril and also furosemide. The patient's blood pressure today is 118/82, so we told her to continue that.  3. History of alcohol abuse. She is seen by psychiatric when she was on detoxification protocol. The patient came in to have alcohol detoxification. So, she was drinking heavily, (>about a fifth  of vodka for 5 months. The patient seen by psychiatric nurse in the ER and because of her hypoxia with oxygen saturations of 88% on room air, she had a CAT scan of the chest, which was concerning for interstitial disease and possible miliary tuberculosis, so she was admitted to medicine, placed on isolation, and the patient  also got antibiotics. Also, this time, the patient did not have any further withdrawal symptoms. Anyways, she was maintained on CIWA protocol. The patient received banana bags with multivitamins. The patient's blood cultures have been negative. She did have abnormal liver functions, but the patient's abdominal ultrasound showed she had hepatitis. The patient's AST and ALT: AST is around 213, ALT is 117, alkaline phosphatase 89.  Vitals;BP 120/79 HR 70o2 sats 98%RA afebrile PHYSICAL EXAMINATION: Today.  CARDIOVASCULAR SYSTEM: S1, S2 regular.  LUNGS: Clear to auscultation. No wheeze. No rales.  ABDOMEN: Soft, nontender, nondistended. Bowel sounds present.   LABORATORY DATA: Showed ethanol 440 on admission. CBC was normal. Salicylate levels were normal. The patient's potassium was 3.2, which was replaced.   DISCHARGE CONDITION: The patient went home in stable condition.   TIME SPENT ON DISCHARGE PREPARATION: More than 30 minutes.   FOLLOWUP APPOINTMENTS: She has no primary doctor. Advised her to follow up with Dr. Sampson GoonFitzgerald.    She said that she will not drink or smoke anymore    ____________________________ Katha HammingSnehalatha Nathyn Luiz, MD sk:mw D: 09/02/2014 12:39:20 ET T: 09/02/2014 14:21:47 ET JOB#: [id number]  cc: Katha HammingSnehalatha Dewarren Ledbetter, MD, <Dictator> Stann Mainlandavid P. Sampson GoonFitzgerald, MD Katha HammingSNEHALATHA Marjan Rosman MD ELECTRONICALLY SIGNED 09/06/2014 14:13

## 2014-11-26 NOTE — H&P (Signed)
PATIENT NAME:  Stacey Hunter, Stacey Hunter MR#:  [id number] DATE OF BIRTH:  October 04, 1980  DATE OF ADMISSION:  08/29/2014  PRIMARY CARE PHYSICIAN: Nonlocal.  REFERRING PHYSICIAN: Dr. Pershing Proud.      CHIEF COMPLAINT: Body tremor.   HISTORY OF PRESENT ILLNESS: A 34 year old Caucasian female with a history of hypertension, COPD, alcohol abuse, presented in the ED with body tremor. The patient is alert, awake, oriented, in no acute distress. The patient has been in the ED for the past 40 hours. She was suspected to have alcohol withdrawal and has been treated with CIWA protocol with a banana bag, but the patient was noticed to have hypoxia at 88 in room air. 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 The patient denies any fever or chills, but has a cough with greenish sputum. The patient also complains of shortness of breath for the past 1 week. The patient denies any chest pain, palpitation, orthopnea, or nocturnal dyspnea. No leg edema. The patient has some mild abdominal pain with diarrhea for the past 3 days, but denies any nausea, vomiting. No melena or bloody stool. The patient denies any travel history. No ill contacts. No family has pneumonia or TB.   PAST MEDICAL HISTORY: Hypertension, alcohol abuse, COPD.   PAST SURGICAL HISTORY: C-section.   FAMILY HISTORY:  Diabetes in her family. Mother has lung cancer.   ALLERGIES: None.  HOME MEDICATIONS:  1. ProAir HFA CFC-free 90 mcg inhalation 2 puffs every 6 hours p.r.n.. 2. Hydrochlorothiazide/lisinopril 25 mg/20 mg p.o. daily.  3. Gabapentin 300 mg p.o. t.i.d. 4. Lasix 20 mg p.o. once a day.  5. Duloxetine 60 mg p.o. daily.  6. Atenolol 50 mg p.o. daily.  7. Acetaminophen/hydrocodone 325 mg/10 mg p.o. daily.   SOCIAL HISTORY: The patient is living with her friend  and the daughter. Smokes 1 pack a day since 89 years old. Drank alcohol many years.  Initially, has 2 cans of beer, but for the past 6  months, the patient has been drinking liquor vodka, 1/2 gallon a day. She has to drink alcohol in the morning. Otherwise, she would have a tremor. She usually does not eat breakfast and lunch. She denies any drug abuse. She denies any sexually transmitted diseases, but she said that she had gonorrhea years ago. She said that she had an HIV test before, but it was negative. The patient is sexually active with 1 partner, using protection.   REVIEW OF SYSTEMS:  CONSTITUTIONAL: The patient denies any fever or chills. No headache or dizziness, but has weakness.  EYES: No double vision or blurred vision.  EARS, NOSE, AND THROAT: No postnasal drip, slurred speech, or dysphagia.  CARDIOVASCULAR: No chest pain, palpitation, orthopnea, or nocturnal dyspnea. No leg edema.  PULMONARY: Positive for cough with greenish sputum and shortness of breath, but no wheezing, no hemoptysis.  GASTROINTESTINAL: Positive for abdominal pain. No nausea or vomiting, but had diarrhea for 3 days, which is watery. No melena or bloody stool.  GENITOURINARY: No dysuria, hematuria, or incontinence.  SKIN: No rash or jaundice.  NEUROLOGY: No syncope, loss of consciousness, or seizure, but has tremor.  ENDOCRINE: No polyuria, polydipsia, heat or cold intolerance.  HEMATOLOGY: No easy bruising or bleeding.   PHYSICAL EXAMINATION: VITAL SIGNS: Temperature 99.1, blood pressure 136/91, pulse was 140 and now is 119, oxygen saturation was 88% in room air, and now on oxygen is 93%.  GENERAL:  The patient is alert, awake, oriented x 3, in no acute distress, morbidly obesity.  HEENT: Pupils round, equal, and reactive to light and accommodation. Moist oral mucosa. Clear oropharynx.  NECK: Supple. No JVD or carotid bruit. No lymphadenopathy. No thyromegaly.  CARDIOVASCULAR: S1, S2 regular rate and rhythm. No murmurs or gallops, tachycardia.   PULMONARY: Bilateral air entry. No wheezing or  rales. No use of accessory muscles to breathe.  ABDOMEN: Obese and soft. No distention or tenderness. No organomegaly. Bowel sounds present.  EXTREMITIES: No edema, clubbing, or cyanosis. No calf tenderness. Bilateral pedal pulses present.  SKIN: No rash or jaundice.  NEUROLOGY: A and O x 3. No focal deficit. Power 5/5. Sensory intact. No tremor at this time.   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 No adenopathy. Chest x-ray: No active disease. Glucose 116, BUN 22, creatinine 0.88, sodium 133, potassium 3.0, chloride 94, bicarbonate 27, bilirubin 1.1, albumin 3.2, SGOT 213, SGPT 11.7. CBC showed WBC 5.1, hemoglobin 14.2, platelets 96, MCV 113. INR 0.9. Acetaminophen less than 2. Salicylates 2.6. ABG showed pH of 7.44, pCO2 48, pO2 of 82, with an FiO2 of 32%. EKG: Showed sinus tachycardia at 103 BPM.   IMPRESSIONS: 1. Hypoxia.  2. Possible pneumonitis, need to rule out tuberculosis.  3. Dehydration.  4. Hypokalemia.  5. Hyponatremia.  6. Alcohol abuse.  7. Tobacco abuse.  8. Morbidity obesity.  9. Thrombocytopenia, possibly due to alcohol abuse.  10. Abnormal liver function tests, possibly due to alcohol abuse.   PLAN OF TREATMENT: The patient will be admitted to medical floor:  1. For hypoxia. Continue oxygen via nasal cannula and nebulizer treatment.  2. For possible pneumonitis and pneumonia, I will start Zithromax and Rocephin. Follow up with blood cultures, sputum culture. The patient has a possiblity of tuberculosis. I will get an airborne isolation and get a PPD test and a sputum test. We will get an ID consult and check HIV test.  3. For dehydration, I will give rehydration with normal saline and follow up BMP.  4. For hypokalemia, we will give potassium supplement and follow up with potassium and magnesium levels.  5.  For alcohol abuse, the patient is already on CIWA protocol with a banana bag. We will continue CIWA protocol.  6. For tobacco abuse, smoking cessation was counseled for 5 minutes. We will give a nicotine patch.  7. For abnormal liver function tests, we will get an abdominal ultrasound. I discussed the patient's condition and plan of treatment with the patient.   TIME SPENT: About 68 minutes.    ____________________________ Shaune PollackQing Robyn Galati, MD qc:mw D: 08/29/2014 13:23:29 ET T: 08/29/2014 13:43:33 ET JOB#: [id number]  cc: Shaune PollackQing Eri Mcevers, MD, <Dictator> Shaune PollackQING Anab Vivar MD ELECTRONICALLY SIGNED 08/31/2014 16:55

## 2014-11-26 NOTE — Consult Note (Signed)
PATIENT NAME:  Stacey Hunter, ANDREONI MR#:  [id number] DATE OF BIRTH:  [birth year]  DATE OF CONSULTATION:  08/29/2014  REFERRING PHYSICIAN:  Shaune Pollack, MD  CONSULTING PHYSICIAN:  Stann Mainland. Sampson Goon, MD  REASON FOR CONSULTATION: Shortness of breath, cough, possible tuberculosis.   HISTORY OF PRESENT ILLNESS: This is a pleasant 34 year old female with history of hypertension, morbid obesity, who came to the Emergency Room February 1st, seeking help with alcohol detox. She apparently had been drinking up to a fifth of vodka daily for the past 5 months. She also has had some  underlying depression. The patient was worked up, as well, for her chronic cough in the ED. She states she has had a cough off and on for years. She does smoke a pack a day for at least 15-20 years. She says she occasionally gets bronchitis flares and has to take an antibiotic. Her most recent episode of this was approximately 2 months ago. She has not, at home, had any fevers, chills, or night sweats. She has had no weight loss. She lives with her friend and her 48-year-old daughter. She has no known TB contacts. She was born and raised in [place] and has no real significant travel history. 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 No other occupational exposures that she is aware of.   PAST MEDICAL HISTORY: 1. Hypertension.  2. Morbid obesity.  3. Chronic back pain on narcotics, as well as gabapentin.   PAST SURGICAL HISTORY: None.  SOCIAL HISTORY: Lives with a friend and her 84-year-old child. She has no known TB contacts. She does smoke at least a pack a day for 15-20 years. Alcohol up to a fifth a day of vodka. Prior to that, was drinking beer.   FAMILY HISTORY: Her father died in an accident, and her mother died of bone cancer.   ALLERGIES: No known drug allergies.   OUTPATIENT MEDICATIONS: Include gabapentin, Cymbalta, hydrochlorothiazide, lisinopril, atenolol, ProAir ,  Lasix.  ANTIBIOTICS SINCE ADMISSION: Include ceftriaxone and azithromycin.   REVIEW OF SYSTEMS: Eleven systems reviewed and negative, except as per HPI.   PHYSICAL EXAMINATION: VITAL SIGNS: Temperature 98.8, pulse 97, blood pressure 132/66, respirations 19, saturation 96% on room air. She has been in the ED for approximately 48 hours. She has had a temperature of 101.2 there on February 1st. At that time, her pulse rate was up to 149. She has also had episodes of desaturation down to 86% on room air.  GENERAL: She is morbidly obese, almost pickwickian in statue. She is lying in bed, having difficulty breathing due to her obesity and being supine. She is coughing quite a bit.  HEENT: Her pupils are reactive. Sclerae are anicteric. Oropharynx is clear with no thrush.  NECK: Supple. No anterior cervical, posterior cervical, or supraclavicular lymphadenopathy.  HEART: Regular. No murmurs.  LUNGS: A very mild expiratory wheeze and prolonged expiratory phase, some mild rhonchi.  ABDOMEN: Soft, obese, nontender.  EXTREMITIES: Trace edema, bilateral lower extremities.  NEUROLOGIC: She is awake, interactive, alert.   LABORATORY DATA: White blood count on admission 7.5, currently 5.1, hemoglobin 14.2, platelets 96. MCV is elevated at 113. INR is normal. D-dimer was elevated at 4000. HIV is negative. Renal function initially had a creatinine of 2.36, now down to 0.88. LFTs showed albumin 3.2, total bilirubin 1.1, AST 213, ALT 117. Troponin was negative.   IMAGING STUDIES: Chest x-ray done on February 1st showed no active disease. CT of  the chest, February 2nd to rule out PE, showed no pulmonary embolism. There is diffuse reticular interstitial disease with miliary pattern. This could be noted that primary tuberculosis or other granulomatous disease can present in this manner, diffuse hypersensitivity pneumonitis may also be in the differential. There is marked hepatic steatosis.   IMPRESSION: A 34 year old  with hypertension, depression, chronic back pain, chronic tobacco abuse, heavy alcohol use, initially admitted to the Emergency Department for alcohol detoxification. She then was having fevers and hypoxia. CT was negative for pulmonary embolism, but does show a diffuse reticular pattern. Human immunodeficiency virus  is negative. She has no systemic symptoms of tuberculosis at home, with no progressive night sweats, weight loss, or febrile illnesses at home. She does have a chronic cough for several years, which she attributes to smoking. She has no other tuberculosis risk factors.   PLAN: 1. I think her chance of having tuberculosis is quite low, although it certainly needs to be ruled out with 3 consecutive sputum. I would suggest sending one today and one in the morning, her first morning sputum and then one later in the day. If these are negative, she can be removed from isolation.  2. I agree with treating for atypical pneumonia with ceftriaxone and azithromycin.  3. I would recommend treating with nebulizers for possible chronic obstructive pulmonary disease/asthma.  4. We can send histoplasma antigens, as histo can sometimes present like this, although it is quite unusual.  5. Thank you for the consult. I will be glad to follow with you    ____________________________ Stann Mainlandavid P. Sampson GoonFitzgerald, MD dpf:mw D: 08/29/2014 15:02:46 ET T: 08/29/2014 16:09:27 ET JOB#: [id number]  cc: Stann Mainlandavid P. Sampson GoonFitzgerald, MD, <Dictator> Palmer Fahrner Sampson GoonFITZGERALD MD ELECTRONICALLY SIGNED 09/06/2014 20:49

## 2015-02-03 IMAGING — CR DG CHEST 1V PORT
1 series · 1 of 1 positions shown · non-contrast
Comparison: 06/25/2014

CLINICAL DATA: Hypoxia, tachycardia.  alcohol intoxication.

EXAM:
PORTABLE CHEST - 1 VIEW

[ap]
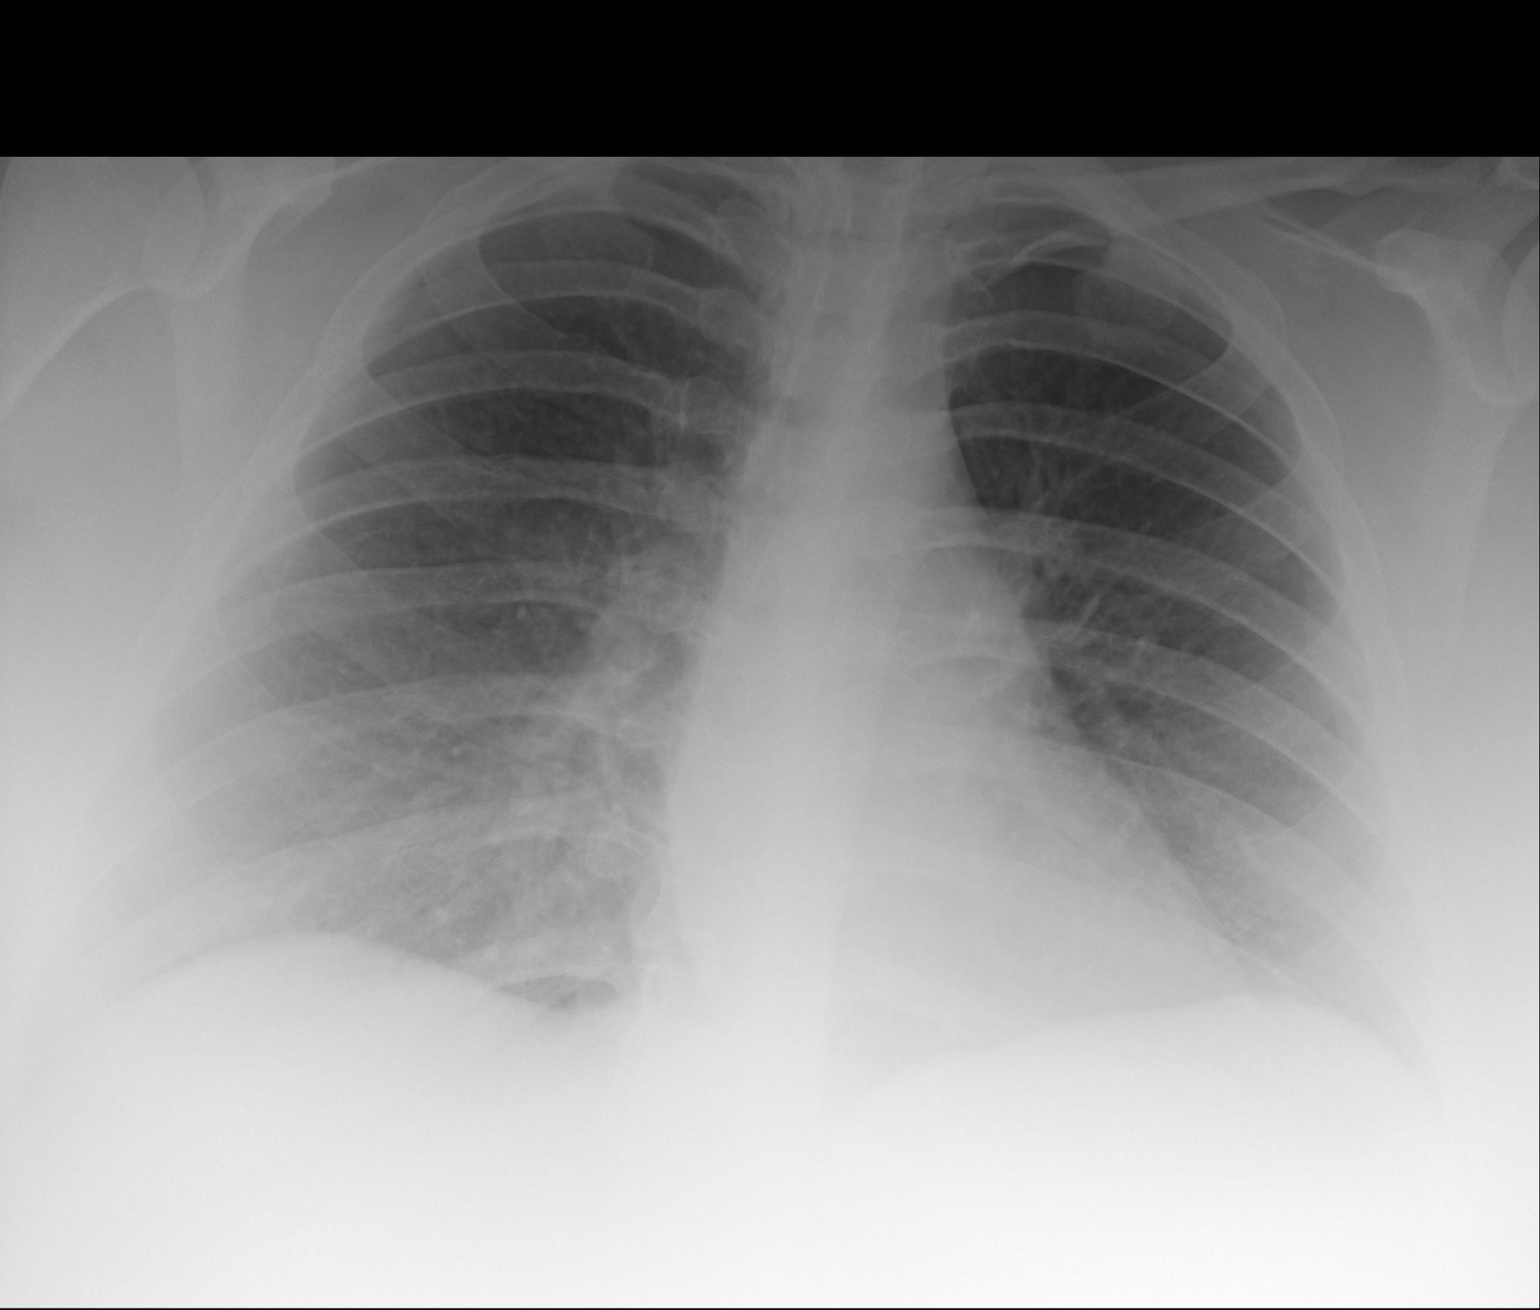

[1 of 1 positions shown; findings below may reference images not displayed]

FINDINGS: The heart size and mediastinal contours are within normal limits.
Both lungs are clear. The visualized skeletal structures are
unremarkable.
IMPRESSION: No active disease.

## 2015-02-07 IMAGING — CR DG CHEST 2V
1 series · 2 of 2 positions shown · non-contrast
Comparison: Chest x-ray 08/28/2014.  Chest CT 08/29/2014.

CLINICAL DATA: Cough and pneumonia.

EXAM:
CHEST  2 VIEW

[Series 1: dxr chest pa (or ap) and lateral · 0.14mm/px · 2 of 2 slices shown]
[im 1/2]
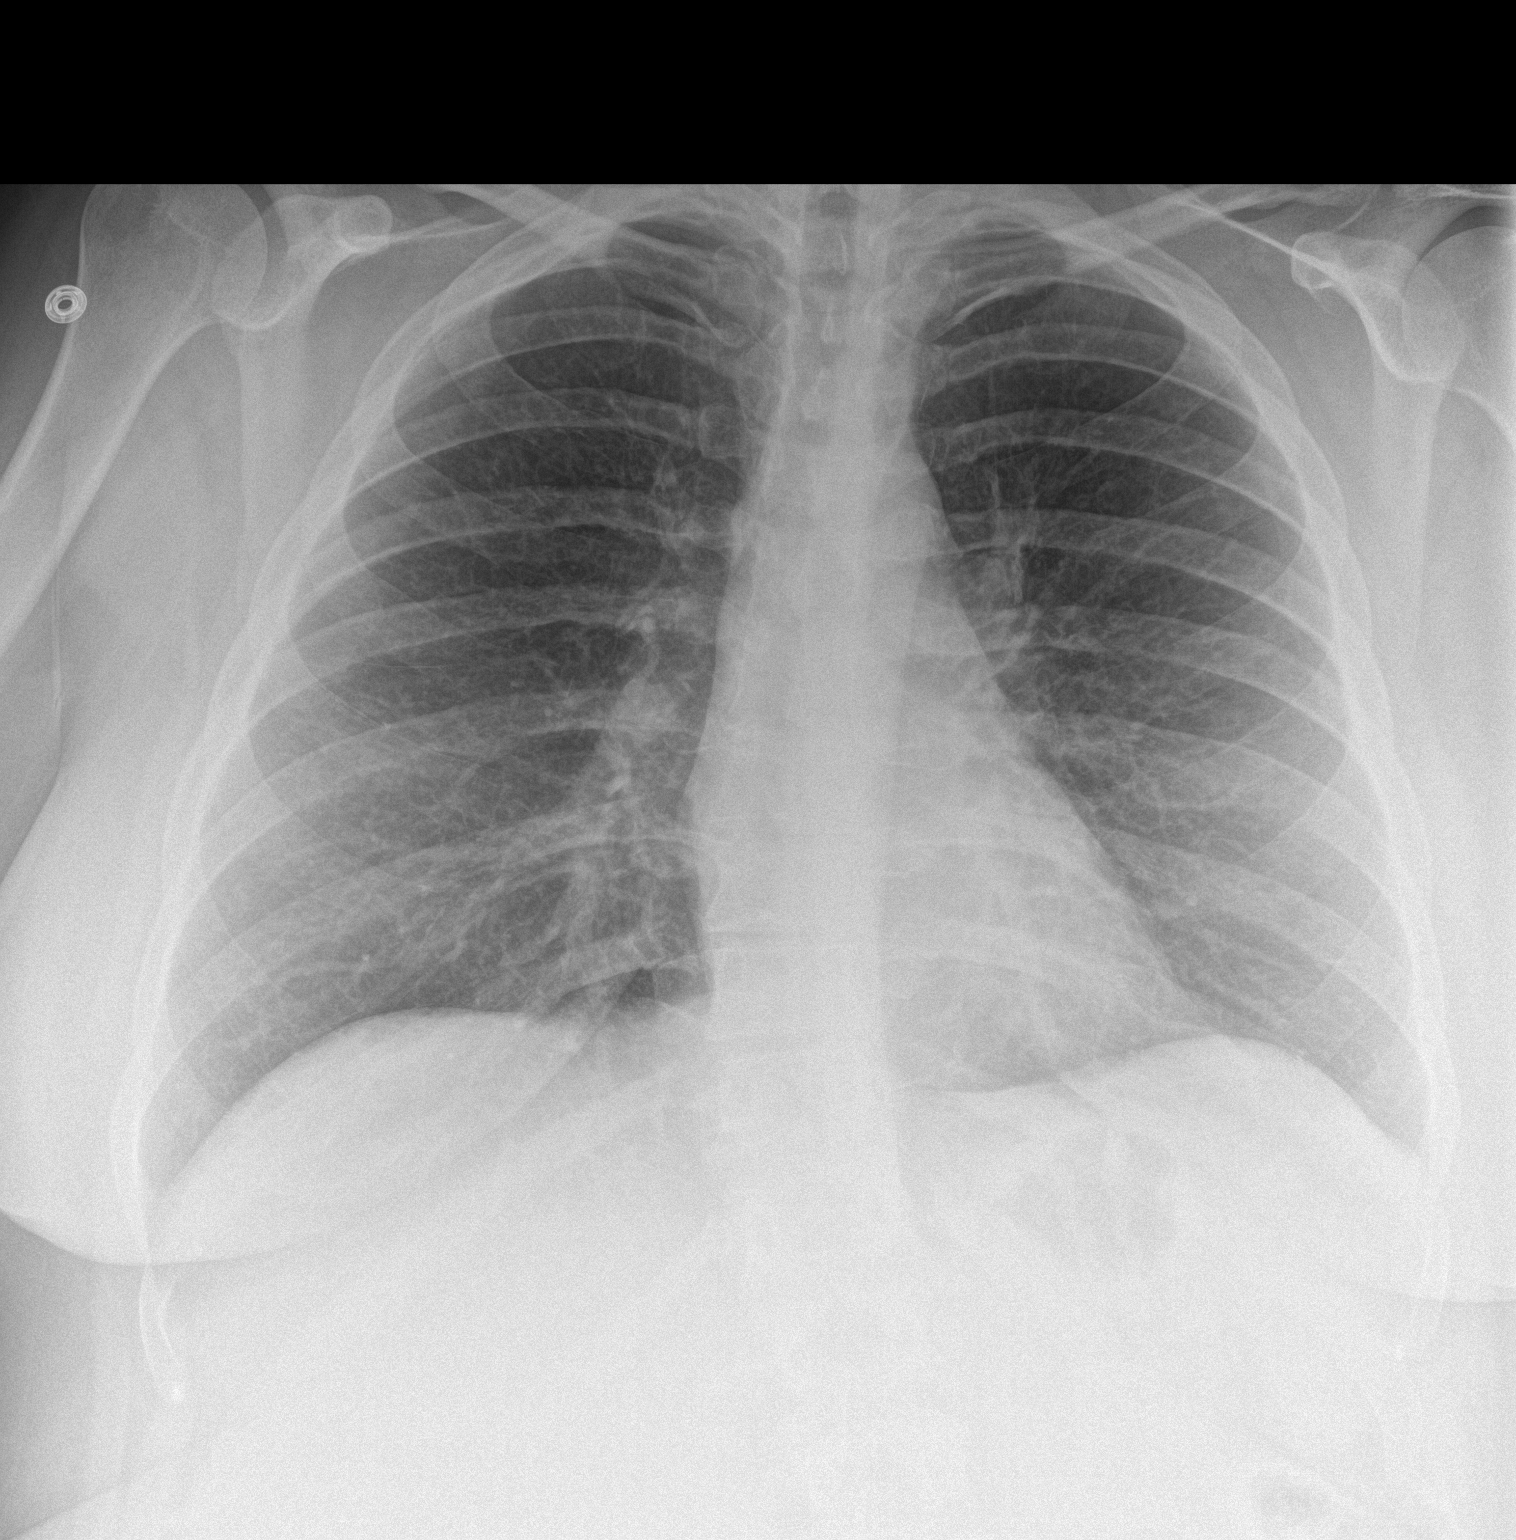
[im 2/2]
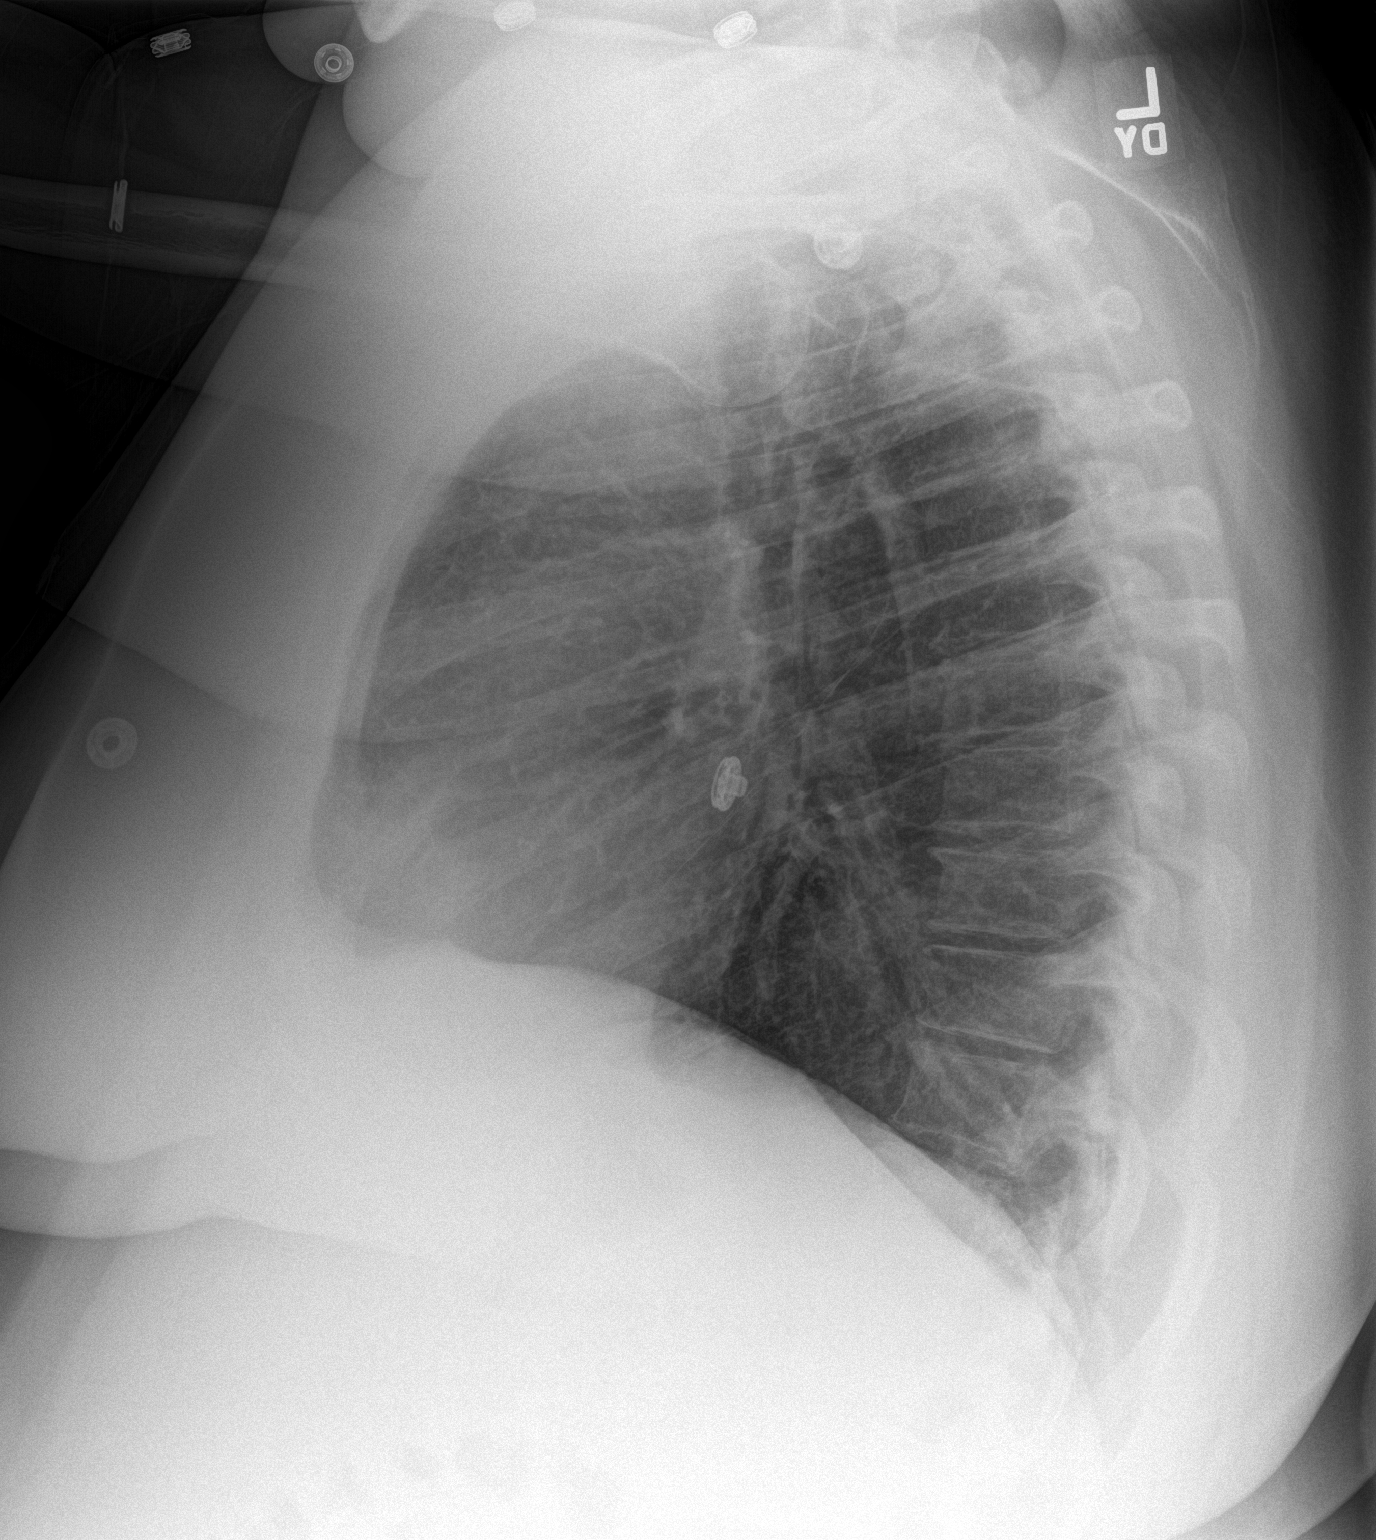

[2 of 2 positions shown; findings below may reference images not displayed]

FINDINGS: Mediastinum and hilar structures normal. Mild diffuse interstitial
prominence noted, reference is made to prior CT report 08/29/2014.
No focal pulmonary alveolar infiltrate. Heart size normal. No
pleural effusion or pneumothorax. No acute bony abnormality.
IMPRESSION: 1. Stable mild pulmonary interstitial prominence, reference is made
to prior chest CT report of 08/29/2014.
[DATE]. No evidence of acute alveolar infiltrate.  Heart size normal.

## 2015-03-10 IMAGING — CR DG CHEST 2V
2 series · 2 of 2 positions shown · non-contrast
Comparison: None.

CLINICAL DATA: Cough and weakness for 3 days

EXAM:
CHEST  2 VIEW

[chest lat]
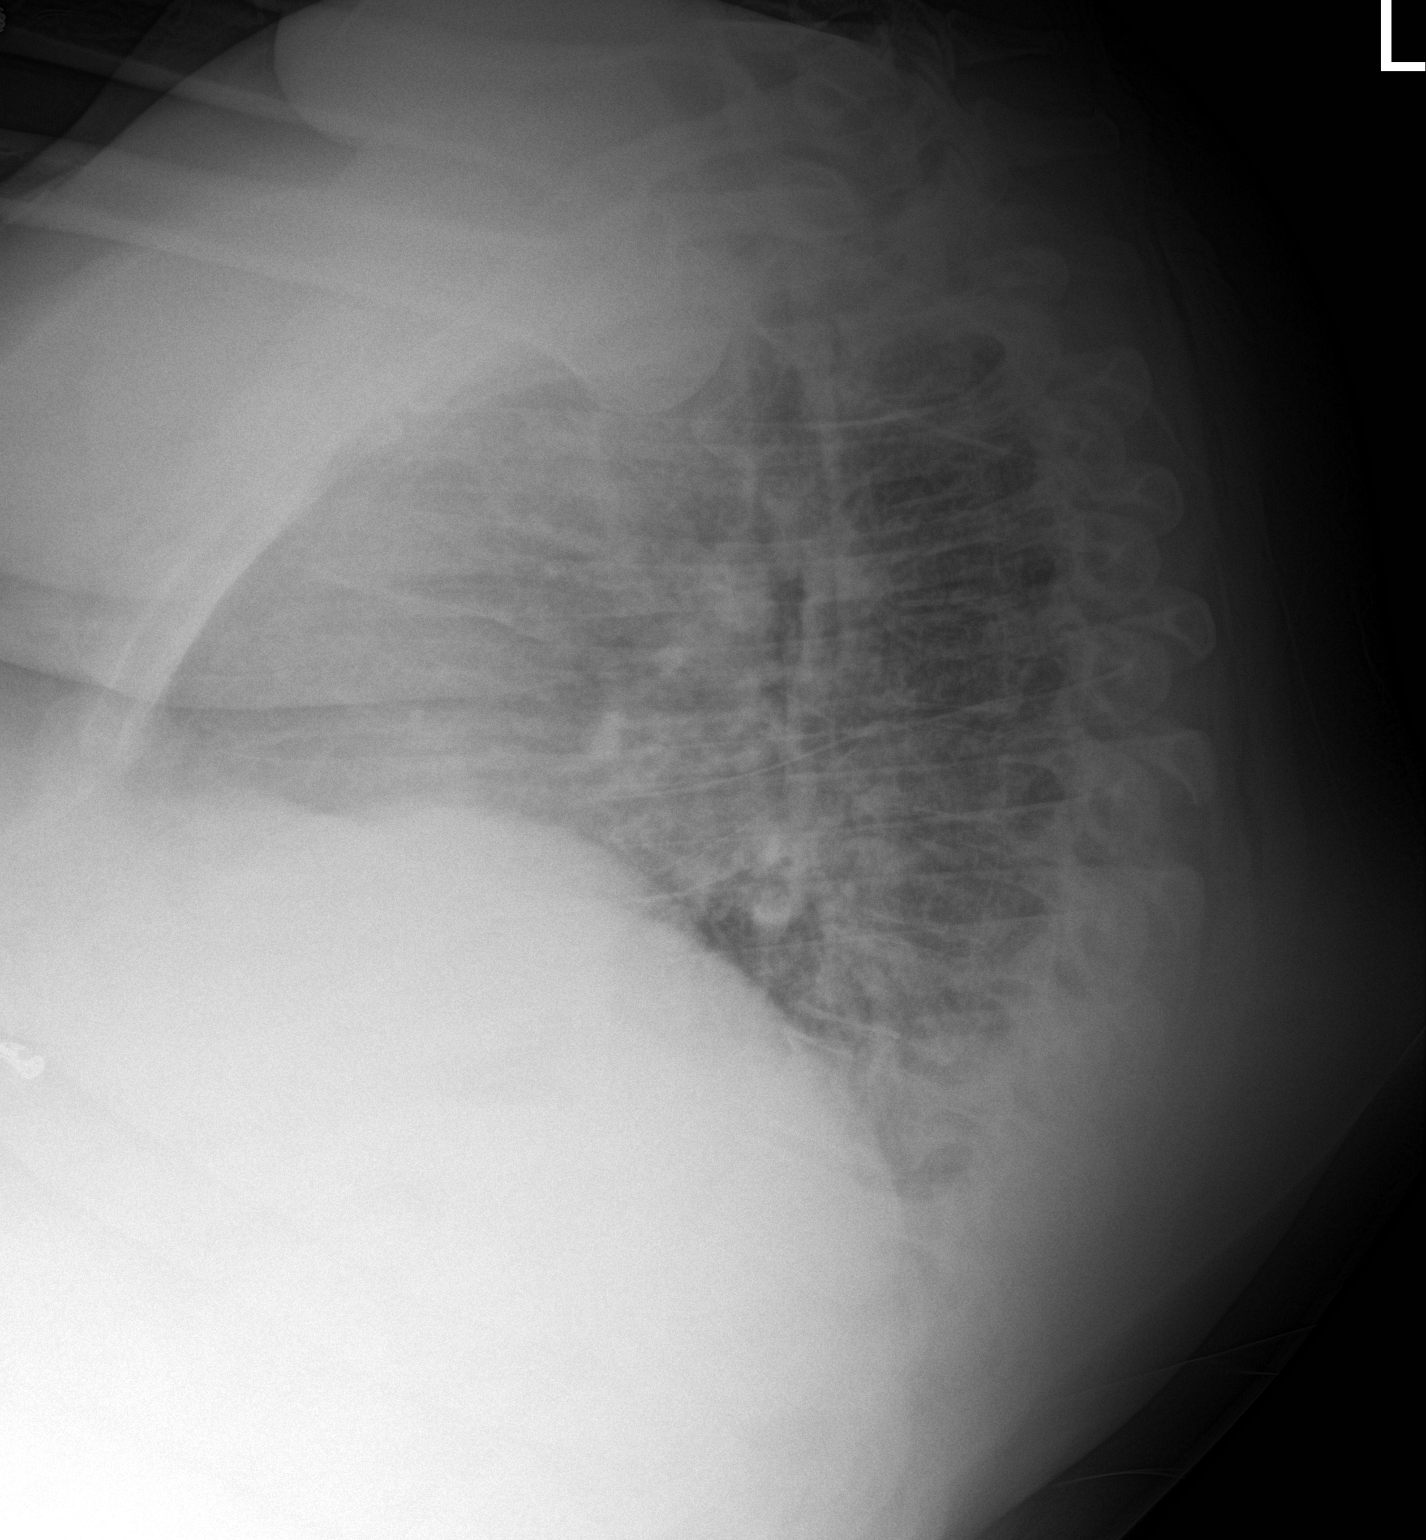

[chest ap]
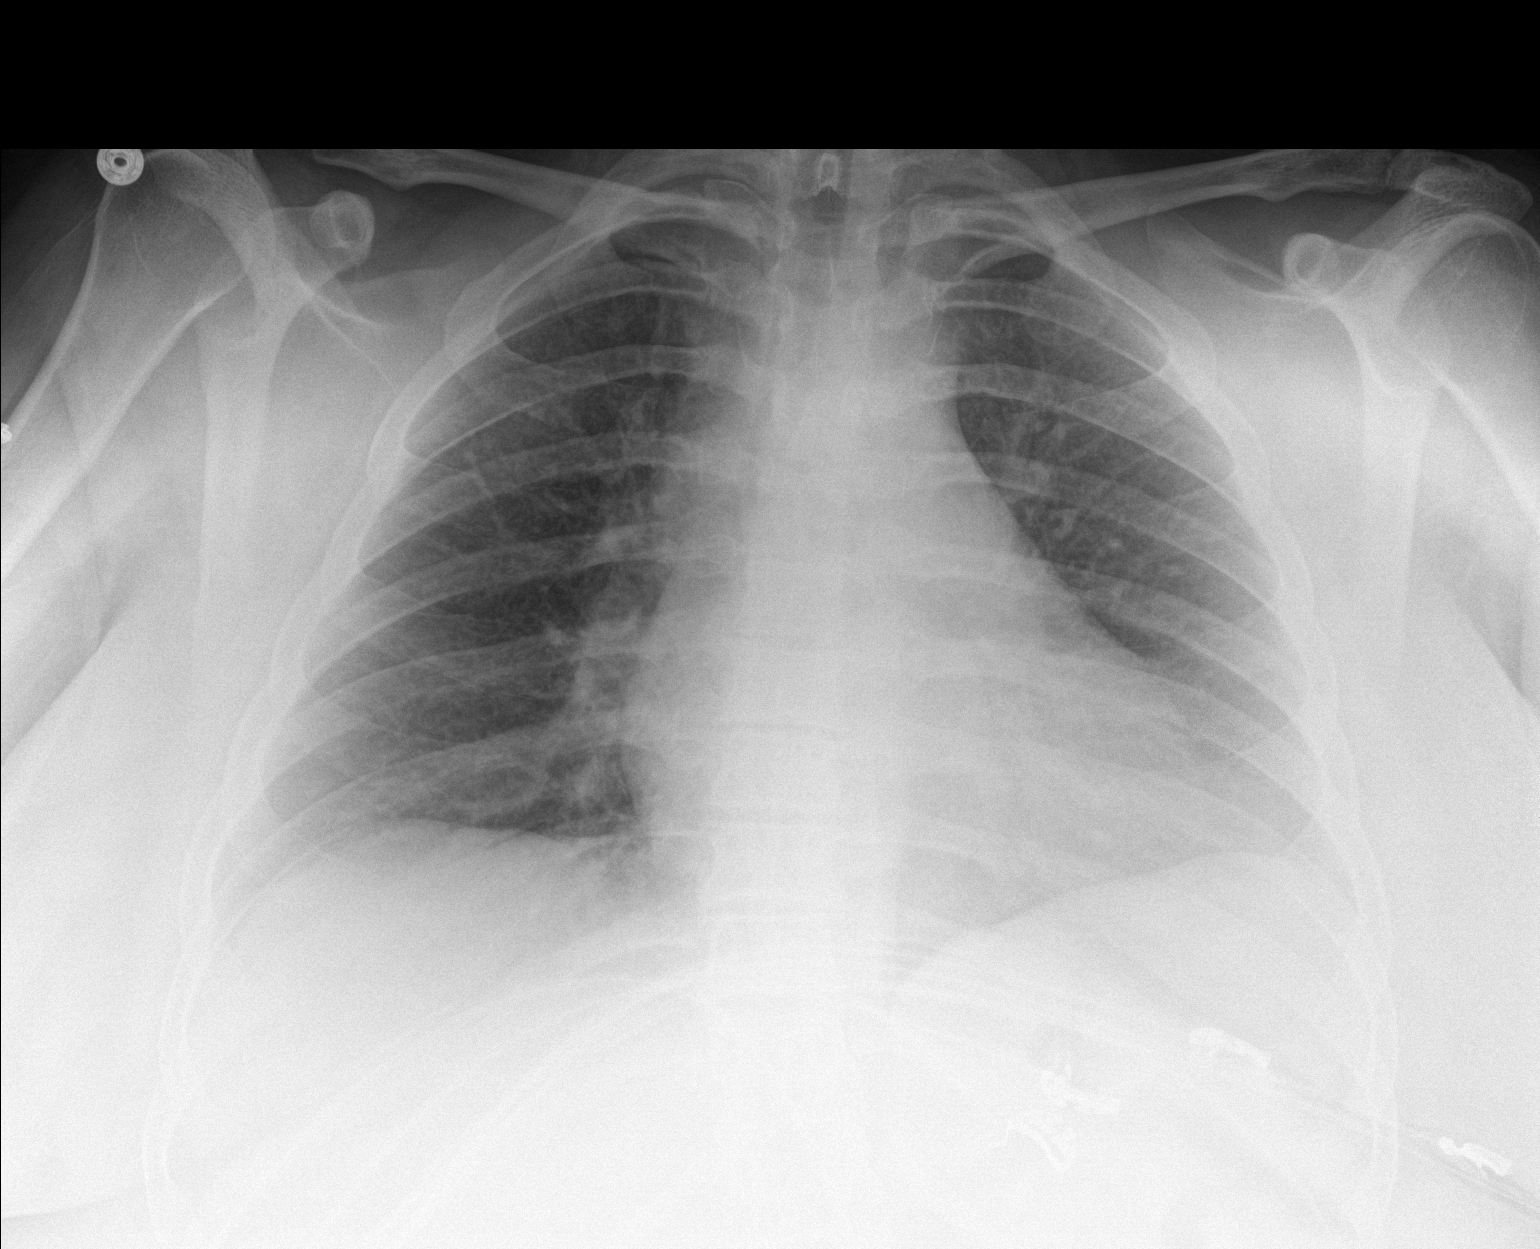

[2 of 2 positions shown; findings below may reference images not displayed]

FINDINGS: Cardiac shadow is mildly enlarged. The lungs are well aerated
bilaterally. No focal infiltrate or sizable effusion is seen. Mild
increased perihilar markings are noted. This may be related to mild
vascular congestion or underlying bronchitic changes. No acute bony
abnormality is noted.
IMPRESSION: Mild increased central markings as described. No focal confluent
infiltrate is seen.

## 2016-01-07 DIAGNOSIS — D721 Eosinophilia: Secondary | ICD-10-CM | POA: Diagnosis not present

## 2016-01-07 DIAGNOSIS — N179 Acute kidney failure, unspecified: Secondary | ICD-10-CM | POA: Diagnosis not present

## 2016-01-07 DIAGNOSIS — R112 Nausea with vomiting, unspecified: Secondary | ICD-10-CM | POA: Diagnosis not present

## 2016-01-07 DIAGNOSIS — N390 Urinary tract infection, site not specified: Secondary | ICD-10-CM | POA: Diagnosis not present

## 2016-01-08 DIAGNOSIS — N179 Acute kidney failure, unspecified: Secondary | ICD-10-CM | POA: Diagnosis not present

## 2016-01-08 DIAGNOSIS — N390 Urinary tract infection, site not specified: Secondary | ICD-10-CM | POA: Diagnosis not present

## 2016-01-08 DIAGNOSIS — R112 Nausea with vomiting, unspecified: Secondary | ICD-10-CM | POA: Diagnosis not present

## 2016-01-08 DIAGNOSIS — E860 Dehydration: Secondary | ICD-10-CM | POA: Diagnosis not present

## 2017-03-30 DIAGNOSIS — I2699 Other pulmonary embolism without acute cor pulmonale: Secondary | ICD-10-CM | POA: Diagnosis not present

## 2017-03-31 DIAGNOSIS — I2699 Other pulmonary embolism without acute cor pulmonale: Secondary | ICD-10-CM | POA: Diagnosis not present

## 2019-01-12 ENCOUNTER — Other Ambulatory Visit: Payer: Self-pay

## 2019-01-12 ENCOUNTER — Emergency Department (HOSPITAL_COMMUNITY)
Admission: EM | Admit: 2019-01-12 | Discharge: 2019-01-13 | Disposition: A | Discharge status: Other Acute Inpatient Hospital | Payer: Medicaid Other | Attending: Emergency Medicine | Admitting: Emergency Medicine

## 2019-01-12 ENCOUNTER — Encounter (HOSPITAL_COMMUNITY): Payer: Self-pay | Admitting: Emergency Medicine

## 2019-01-12 DIAGNOSIS — F1020 Alcohol dependence, uncomplicated: Secondary | ICD-10-CM | POA: Diagnosis not present

## 2019-01-12 DIAGNOSIS — Z20828 Contact with and (suspected) exposure to other viral communicable diseases: Secondary | ICD-10-CM | POA: Insufficient documentation

## 2019-01-12 DIAGNOSIS — Z79899 Other long term (current) drug therapy: Secondary | ICD-10-CM | POA: Insufficient documentation

## 2019-01-12 DIAGNOSIS — F1010 Alcohol abuse, uncomplicated: Secondary | ICD-10-CM | POA: Diagnosis not present

## 2019-01-12 DIAGNOSIS — Z008 Encounter for other general examination: Secondary | ICD-10-CM | POA: Insufficient documentation

## 2019-01-12 DIAGNOSIS — F329 Major depressive disorder, single episode, unspecified: Secondary | ICD-10-CM | POA: Diagnosis not present

## 2019-01-12 DIAGNOSIS — R45851 Suicidal ideations: Secondary | ICD-10-CM

## 2019-01-12 DIAGNOSIS — E669 Obesity, unspecified: Secondary | ICD-10-CM | POA: Diagnosis not present

## 2019-01-12 DIAGNOSIS — E860 Dehydration: Secondary | ICD-10-CM | POA: Diagnosis not present

## 2019-01-12 DIAGNOSIS — R112 Nausea with vomiting, unspecified: Secondary | ICD-10-CM | POA: Diagnosis present

## 2019-01-12 DIAGNOSIS — F17200 Nicotine dependence, unspecified, uncomplicated: Secondary | ICD-10-CM | POA: Insufficient documentation

## 2019-01-12 LAB — CBC WITH DIFFERENTIAL/PLATELET
Abs Immature Granulocytes: 0.07 10*3/uL (ref 0.00–0.07)
Basophils Absolute: 0.2 10*3/uL — ABNORMAL HIGH (ref 0.0–0.1)
Basophils Relative: 1 %
Eosinophils Absolute: 0 10*3/uL (ref 0.0–0.5)
Eosinophils Relative: 0 %
HCT: 45 % (ref 36.0–46.0)
Hemoglobin: 15.7 g/dL — ABNORMAL HIGH (ref 12.0–15.0)
Immature Granulocytes: 1 %
Lymphocytes Relative: 7 %
Lymphs Abs: 0.9 10*3/uL (ref 0.7–4.0)
MCH: 34.7 pg — ABNORMAL HIGH (ref 26.0–34.0)
MCHC: 34.9 g/dL (ref 30.0–36.0)
MCV: 99.3 fL (ref 80.0–100.0)
Monocytes Absolute: 0.5 10*3/uL (ref 0.1–1.0)
Monocytes Relative: 4 %
Neutro Abs: 11.4 10*3/uL — ABNORMAL HIGH (ref 1.7–7.7)
Neutrophils Relative %: 87 %
Platelets: 434 10*3/uL — ABNORMAL HIGH (ref 150–400)
RBC: 4.53 MIL/uL (ref 3.87–5.11)
RDW: 13.2 % (ref 11.5–15.5)
WBC: 13 10*3/uL — ABNORMAL HIGH (ref 4.0–10.5)
nRBC: 0 % (ref 0.0–0.2)

## 2019-01-12 LAB — RAPID URINE DRUG SCREEN, HOSP PERFORMED
Amphetamines: NOT DETECTED
Barbiturates: NOT DETECTED
Benzodiazepines: POSITIVE — AB
Cocaine: NOT DETECTED
Opiates: NOT DETECTED
Tetrahydrocannabinol: POSITIVE — AB

## 2019-01-12 LAB — COMPREHENSIVE METABOLIC PANEL
ALT: 59 U/L — ABNORMAL HIGH (ref 0–44)
AST: 69 U/L — ABNORMAL HIGH (ref 15–41)
Albumin: 4.2 g/dL (ref 3.5–5.0)
Alkaline Phosphatase: 75 U/L (ref 38–126)
Anion gap: 23 — ABNORMAL HIGH (ref 5–15)
BUN: 11 mg/dL (ref 6–20)
CO2: 22 mmol/L (ref 22–32)
Calcium: 9.8 mg/dL (ref 8.9–10.3)
Chloride: 88 mmol/L — ABNORMAL LOW (ref 98–111)
Creatinine, Ser: 1.13 mg/dL — ABNORMAL HIGH (ref 0.44–1.00)
GFR calc Af Amer: >60 mL/min (ref >60)
GFR calc non Af Amer: >60 mL/min (ref >60)
Glucose, Bld: 109 mg/dL — ABNORMAL HIGH (ref 70–99)
Potassium: 4.5 mmol/L (ref 3.5–5.1)
Sodium: 133 mmol/L — ABNORMAL LOW (ref 135–145)
Total Bilirubin: 2.2 mg/dL — ABNORMAL HIGH (ref 0.3–1.2)
Total Protein: 7.7 g/dL (ref 6.5–8.1)

## 2019-01-12 LAB — ETHANOL: Alcohol, Ethyl (B): <10 mg/dL (ref <10)

## 2019-01-12 LAB — I-STAT BETA HCG BLOOD, ED (MC, WL, AP ONLY): I-stat hCG, quantitative: <5 m[IU]/mL (ref <5)

## 2019-01-12 MED ORDER — SODIUM CHLORIDE 0.9 % IV BOLUS
1000.0000 mL | Freq: Once | INTRAVENOUS | Status: AC
  Administered 2019-01-12: 1000 mL via INTRAVENOUS

## 2019-01-12 MED ORDER — THIAMINE HCL 100 MG/ML IJ SOLN
100.0000 mg | Freq: Every day | INTRAMUSCULAR | Status: DC
  Administered 2019-01-12: 100 mg via INTRAVENOUS
  Filled 2019-01-12: qty 2

## 2019-01-12 MED ORDER — LORAZEPAM 2 MG/ML IJ SOLN: 0.0000 mg | Freq: Two times a day (BID) | INTRAMUSCULAR | Status: DC

## 2019-01-12 MED ORDER — LORAZEPAM 1 MG PO TABS: 0.0000 mg | ORAL_TABLET | Freq: Four times a day (QID) | ORAL | Status: DC

## 2019-01-12 MED ORDER — SODIUM CHLORIDE 0.9 % IV BOLUS
1000.0000 mL | Freq: Once | INTRAVENOUS | Status: AC
  Administered 2019-01-12: 19:00:00 1000 mL via INTRAVENOUS

## 2019-01-12 MED ORDER — PROMETHAZINE HCL 25 MG/ML IJ SOLN
12.5000 mg | Freq: Once | INTRAMUSCULAR | Status: AC
  Administered 2019-01-12: 12.5 mg via INTRAVENOUS
  Filled 2019-01-12: qty 1

## 2019-01-12 MED ORDER — LORAZEPAM 1 MG PO TABS: 0.0000 mg | ORAL_TABLET | Freq: Two times a day (BID) | ORAL | Status: DC

## 2019-01-12 MED ORDER — LORAZEPAM 2 MG/ML IJ SOLN
0.0000 mg | Freq: Four times a day (QID) | INTRAMUSCULAR | Status: DC
  Administered 2019-01-12: 2 mg via INTRAVENOUS
  Filled 2019-01-12: qty 1

## 2019-01-12 MED ORDER — VITAMIN B-1 100 MG PO TABS: 100.0000 mg | ORAL_TABLET | Freq: Every day | ORAL | Status: DC

## 2019-01-12 NOTE — ED Notes (Signed)
Pt states that she will attempt to give a urine sample after her fluids finish.

## 2019-01-12 NOTE — ED Notes (Signed)
L;ast drink last night  6 24 oz she states

## 2019-01-12 NOTE — ED Provider Notes (Signed)
[facility]   CSN: [id number] Arrival date & time: 01/12/19  1356     History   Chief Complaint Chief Complaint  Patient presents with  . Alcohol Problem    HPI Stacey Hunter is a 38 y.o. female who presents with weakness, nausea vomiting, abdominal pain.  Past medical history significant for obesity, alcohol abuse.  Patient states that she was previously sober for several months after detoxing on her own.  About 10 days ago she went on a drinking binge and was drinking high percentage alcohol beers every day.  She had her last drink last night.  Since then she has been tremulous, having multiple episodes of nausea and vomiting, and having diffuse muscle cramping and abdominal cramping.  She denies fever, chills, cough, shortness of breath, hematemesis, blood in the stool.  She states that she feels suicidal and has been wanting to end her life.  She had a bottle of Xanax and had a plan to take all of it at once but then ended up flushing it down the toilet because she did not want to do that to her daughter.  She feels very weak and dehydrated and her urine is dark.  No history of alcohol withdrawal seizures or DTs although she says that sometimes she will hallucinate while she is heavily drinking.     HPI  No past medical history on file.  There are no active problems to display for this patient.   No past surgical history on file.   OB History   No obstetric history on file.      Home Medications    Prior to Admission medications   Medication Sig Start Date End Date Taking? Authorizing Provider  chlordiazePOXIDE (LIBRIUM) 25 MG capsule Take 2-4 capsules (50-100 mg total) by mouth 3 (three) times daily as needed for withdrawal. Take 2-4 capsules four times daily on day one, take 2-4 capsules three times daily on day 2, Take 2-4 capsules twice daily on day 3, take 2-4 capsules at bedtime on day four 08/16/14   Toy Cookeyocherty,  Megan, MD  DULoxetine (CYMBALTA) 60 MG capsule Take 60 mg by mouth daily.    [provider]  furosemide (LASIX) 20 MG tablet Take 20 mg by mouth daily.     [provider]  gabapentin (NEURONTIN) 100 MG capsule Take 100 mg by mouth 2 (two) times daily.    [provider]  HYDROcodone-acetaminophen (NORCO) 10-325 MG per tablet Take 1 tablet by mouth 2 (two) times daily.     [provider]  PRESCRIPTION MEDICATION Take 1 tablet by mouth daily. BP med?    [provider]    Family History No family history on file.  Social History Social History   Tobacco Use  . Smoking status: Current Every Day Smoker    Packs/day: 0.00  Substance Use Topics  . Alcohol use: Yes    Comment: daily   . Drug use: Yes    Types: Marijuana     Allergies   Patient has no known allergies.   Review of Systems Review of Systems  Constitutional: Positive for activity change, appetite change and fatigue. Negative for fever.  Respiratory: Negative for shortness of breath.   Cardiovascular: Negative for chest pain.  Gastrointestinal: Positive for abdominal pain, nausea and vomiting. Negative for constipation and diarrhea.  Genitourinary: Negative for dysuria.  Musculoskeletal: Positive for myalgias.  Neurological: Positive for weakness.  All other systems reviewed  and are negative.    Physical Exam Updated Vital Signs BP (!) 125/93 (BP Location: Right Arm)   Pulse (!) 118   Temp 98.5 F (36.9 C) (Oral)   Resp 20   SpO2 99%   Physical Exam Vitals signs and nursing note reviewed.  Constitutional:      General: She is in acute distress.     Appearance: She is well-developed. She is obese. She is not ill-appearing.     Comments: Tearful, cooperative. Uncomfortable appearing  HENT:     Head: Normocephalic and atraumatic.  Eyes:     General: No scleral icterus.       Right eye: No discharge.        Left eye: No discharge.     Conjunctiva/sclera:  Conjunctivae normal.     Pupils: Pupils are equal, round, and reactive to light.  Neck:     Musculoskeletal: Normal range of motion.  Cardiovascular:     Rate and Rhythm: Tachycardia present.  Pulmonary:     Effort: Pulmonary effort is normal. No respiratory distress.     Breath sounds: Normal breath sounds.  Abdominal:     General: There is no distension.     Palpations: Abdomen is soft.     Tenderness: There is abdominal tenderness (generalized).  Skin:    General: Skin is warm and dry.  Neurological:     Mental Status: She is alert and oriented to person, place, and time.  Psychiatric:        Attention and Perception: Attention normal.        Mood and Affect: Mood is depressed. Affect is tearful.        Speech: Speech normal.        Behavior: Behavior normal. Behavior is cooperative.        Thought Content: Thought content includes suicidal ideation. Thought content does not include homicidal ideation. Thought content includes suicidal plan. Thought content does not include homicidal plan.      ED Treatments / Results  Labs (all labs ordered are listed, but only abnormal results are displayed) Labs Reviewed  COMPREHENSIVE METABOLIC PANEL - Abnormal; Notable for the following components:      Result Value   Sodium 133 (*)    Chloride 88 (*)    Glucose, Bld 109 (*)    Creatinine, Ser 1.13 (*)    AST 69 (*)    ALT 59 (*)    Total Bilirubin 2.2 (*)    Anion gap 23 (*)    All other components within normal limits  CBC WITH DIFFERENTIAL/PLATELET - Abnormal; Notable for the following components:   WBC 13.0 (*)    Hemoglobin 15.7 (*)    MCH 34.7 (*)    Platelets 434 (*)    Neutro Abs 11.4 (*)    Basophils Absolute 0.2 (*)    All other components within normal limits  RAPID URINE DRUG SCREEN, HOSP PERFORMED - Abnormal; Notable for the following components:   Benzodiazepines POSITIVE (*)    Tetrahydrocannabinol POSITIVE (*)    All other components within normal limits   ETHANOL  I-STAT BETA HCG BLOOD, ED ([facility], [facility], [facility] ONLY)    EKG    Radiology No results found.  Procedures Procedures (including critical care time)  Medications Ordered in ED Medications  LORazepam (ATIVAN) injection 0-4 mg (2 mg Intravenous Given 01/12/19 1444)    Or  LORazepam (ATIVAN) tablet 0-4 mg ( Oral See Alternative 01/12/19 1444)  LORazepam (ATIVAN) injection 0-4  mg (has no administration in time range)    Or  LORazepam (ATIVAN) tablet 0-4 mg (has no administration in time range)  thiamine (VITAMIN B-1) tablet 100 mg ( Oral See Alternative 01/12/19 1557)    Or  thiamine (B-1) injection 100 mg (100 mg Intravenous Given 01/12/19 1557)  sodium chloride 0.9 % bolus 1,000 mL (has no administration in time range)  promethazine (PHENERGAN) injection 12.5 mg (12.5 mg Intravenous Given 01/12/19 1443)  sodium chloride 0.9 % bolus 1,000 mL (0 mLs Intravenous Stopped 01/12/19 1639)  sodium chloride 0.9 % bolus 1,000 mL (1,000 mLs Intravenous New Bag/Given 01/12/19 1640)     Initial Impression / Assessment and Plan / ED Course  I have reviewed the triage vital signs and the nursing notes.  Pertinent labs & imaging results that were available during my care of the patient were reviewed by me and considered in my medical decision making (see chart for details).  38 year old female presents with acute alcohol withdrawal after a 10-day alcohol binge.  She is tachycardic on arrival with a heart rate in the 130s.  She is very uncomfortable appearing and is having multiple muscle spasms.  She feels dehydrated.  Will initiate fluids and nausea medicine.  We will start the patient on CIWA protocol.  She is expressing suicidal ideation.  Will obtain blood work and urine.  CBC is remarkable for elevated white blood cell count, hemoglobin, and platelets.  Likely from hemoconcentration.  She has mild hyponatremia and hypochloremia.  She has a mild AKI.  Elevated anion gap which is due to alcohol.  LFTs  and bilirubin are mildly elevated also likely from alcohol.  Her EtOH is 0.  Urine drug screen is positive for benzos and marijuana.  After 2 L her heart rate has normalized but she still feels weak.  Her nausea is improved.  We will give an additional fluid bolus and p.o. challenge.  She is still expressing suicidal ideation and depressive thoughts.  Will consult TTS   Final Clinical Impressions(s) / ED Diagnoses   Final diagnoses:  Dehydration  Alcohol abuse  Suicidal ideation    ED Discharge Orders    None       Bethel BornGekas, Katharina Jehle Marie, PA-C 01/12/19 2108    Mancel BaleWentz, Elliott, MD 01/13/19 1328
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
## 2019-01-12 NOTE — ED Notes (Signed)
This RN acting as Art therapist and asked pt if she would like for me to call any family/friends. Pt declined and states she is able to contact loved ones.

## 2019-01-12 NOTE — ED Notes (Signed)
TTS in process 

## 2019-01-12 NOTE — ED Triage Notes (Signed)
Pt arrives to ED in alcohol withdrawal after doing a 10-day binger and not drinking again since last night. Pt is dry heaving and tearful in triage.

## 2019-01-13 ENCOUNTER — Encounter (HOSPITAL_COMMUNITY): Payer: Self-pay

## 2019-01-13 ENCOUNTER — Inpatient Hospital Stay (HOSPITAL_COMMUNITY)
Admission: AD | Admit: 2019-01-13 | Discharge: 2019-01-17 | DRG: 885 | Disposition: A | Discharge status: Home / Self Care | Payer: Medicaid Other | Source: Intra-hospital | Attending: Psychiatry | Admitting: Psychiatry

## 2019-01-13 DIAGNOSIS — F1020 Alcohol dependence, uncomplicated: Secondary | ICD-10-CM | POA: Diagnosis present

## 2019-01-13 DIAGNOSIS — G4700 Insomnia, unspecified: Secondary | ICD-10-CM | POA: Diagnosis present

## 2019-01-13 DIAGNOSIS — R000 Tachycardia, unspecified: Secondary | ICD-10-CM | POA: Diagnosis present

## 2019-01-13 DIAGNOSIS — I4460 Unspecified fascicular block: Secondary | ICD-10-CM | POA: Diagnosis present

## 2019-01-13 DIAGNOSIS — F332 Major depressive disorder, recurrent severe without psychotic features: Principal | ICD-10-CM | POA: Diagnosis present

## 2019-01-13 DIAGNOSIS — F17200 Nicotine dependence, unspecified, uncomplicated: Secondary | ICD-10-CM | POA: Diagnosis present

## 2019-01-13 DIAGNOSIS — R45851 Suicidal ideations: Secondary | ICD-10-CM | POA: Diagnosis present

## 2019-01-13 DIAGNOSIS — Z79899 Other long term (current) drug therapy: Secondary | ICD-10-CM

## 2019-01-13 DIAGNOSIS — E860 Dehydration: Secondary | ICD-10-CM | POA: Diagnosis not present

## 2019-01-13 DIAGNOSIS — F419 Anxiety disorder, unspecified: Secondary | ICD-10-CM | POA: Diagnosis present

## 2019-01-13 DIAGNOSIS — I10 Essential (primary) hypertension: Secondary | ICD-10-CM | POA: Diagnosis present

## 2019-01-13 LAB — HEMOGLOBIN A1C
Hgb A1c MFr Bld: 5 % (ref 4.8–5.6)
Mean Plasma Glucose: 96.8 mg/dL

## 2019-01-13 LAB — TSH: TSH: 5.491 u[IU]/mL — ABNORMAL HIGH (ref 0.350–4.500)

## 2019-01-13 LAB — LIPID PANEL
Cholesterol: 229 mg/dL — ABNORMAL HIGH (ref 0–200)
HDL: 98 mg/dL (ref >40)
LDL Cholesterol: 114 mg/dL — ABNORMAL HIGH (ref 0–99)
Total CHOL/HDL Ratio: 2.3 RATIO
Triglycerides: 85 mg/dL (ref <150)
VLDL: 17 mg/dL (ref 0–40)

## 2019-01-13 LAB — SARS CORONAVIRUS 2: SARS Coronavirus 2: NOT DETECTED

## 2019-01-13 MED ORDER — NICOTINE POLACRILEX 2 MG MT GUM
2.0000 mg | CHEWING_GUM | OROMUCOSAL | Status: DC | PRN
  Administered 2019-01-13 (×2): 2 mg via ORAL

## 2019-01-13 MED ORDER — LORAZEPAM 1 MG PO TABS
1.0000 mg | ORAL_TABLET | Freq: Four times a day (QID) | ORAL | Status: DC
  Filled 2019-01-13: qty 1

## 2019-01-13 MED ORDER — ONDANSETRON 4 MG PO TBDP: 4.0000 mg | ORAL_TABLET | Freq: Four times a day (QID) | ORAL | Status: AC | PRN

## 2019-01-13 MED ORDER — LORAZEPAM 1 MG PO TABS: 1.0000 mg | ORAL_TABLET | Freq: Three times a day (TID) | ORAL | Status: DC

## 2019-01-13 MED ORDER — LORAZEPAM 1 MG PO TABS
1.0000 mg | ORAL_TABLET | Freq: Four times a day (QID) | ORAL | Status: AC | PRN
  Administered 2019-01-13: 1 mg via ORAL
  Filled 2019-01-13: qty 1

## 2019-01-13 MED ORDER — VITAMIN B-1 100 MG PO TABS
100.0000 mg | ORAL_TABLET | Freq: Every day | ORAL | Status: DC
  Administered 2019-01-14 – 2019-01-17 (×4): 100 mg via ORAL
  Filled 2019-01-13 (×6): qty 1

## 2019-01-13 MED ORDER — ADULT MULTIVITAMIN W/MINERALS CH
1.0000 | ORAL_TABLET | Freq: Every day | ORAL | Status: DC
  Administered 2019-01-13 – 2019-01-17 (×5): 1 via ORAL
  Filled 2019-01-13 (×7): qty 1

## 2019-01-13 MED ORDER — LORAZEPAM 1 MG PO TABS: 1.0000 mg | ORAL_TABLET | Freq: Every day | ORAL | Status: DC

## 2019-01-13 MED ORDER — ACETAMINOPHEN 325 MG PO TABS
650.0000 mg | ORAL_TABLET | Freq: Four times a day (QID) | ORAL | Status: DC | PRN
  Administered 2019-01-13 – 2019-01-17 (×10): 650 mg via ORAL
  Filled 2019-01-13 (×10): qty 2

## 2019-01-13 MED ORDER — TRAZODONE HCL 50 MG PO TABS
50.0000 mg | ORAL_TABLET | Freq: Every evening | ORAL | Status: DC | PRN
  Administered 2019-01-13 – 2019-01-16 (×3): 50 mg via ORAL
  Filled 2019-01-13 (×3): qty 1

## 2019-01-13 MED ORDER — FOLIC ACID 1 MG PO TABS
1.0000 mg | ORAL_TABLET | Freq: Every day | ORAL | Status: DC
  Administered 2019-01-13 – 2019-01-17 (×5): 1 mg via ORAL
  Filled 2019-01-13 (×7): qty 1

## 2019-01-13 MED ORDER — LORAZEPAM 1 MG PO TABS: 1.0000 mg | ORAL_TABLET | Freq: Two times a day (BID) | ORAL | Status: DC

## 2019-01-13 MED ORDER — LOPERAMIDE HCL 2 MG PO CAPS
2.0000 mg | ORAL_CAPSULE | ORAL | Status: AC | PRN
  Administered 2019-01-13: 4 mg via ORAL
  Filled 2019-01-13: qty 2

## 2019-01-13 MED ORDER — SERTRALINE HCL 25 MG PO TABS
25.0000 mg | ORAL_TABLET | Freq: Every day | ORAL | Status: DC
  Administered 2019-01-13 – 2019-01-15 (×3): 25 mg via ORAL
  Filled 2019-01-13 (×5): qty 1

## 2019-01-13 MED ORDER — ALUM & MAG HYDROXIDE-SIMETH 200-200-20 MG/5ML PO SUSP: 30.0000 mL | ORAL | Status: DC | PRN

## 2019-01-13 MED ORDER — MAGNESIUM HYDROXIDE 400 MG/5ML PO SUSP: 30.0000 mL | Freq: Every day | ORAL | Status: DC | PRN

## 2019-01-13 MED ORDER — CLONIDINE HCL 0.1 MG PO TABS
0.1000 mg | ORAL_TABLET | Freq: Three times a day (TID) | ORAL | Status: DC | PRN
  Administered 2019-01-16: 0.1 mg via ORAL
  Filled 2019-01-13: qty 1

## 2019-01-13 MED ORDER — METOPROLOL TARTRATE 25 MG PO TABS
25.0000 mg | ORAL_TABLET | Freq: Two times a day (BID) | ORAL | Status: DC
  Administered 2019-01-13 – 2019-01-14 (×2): 25 mg via ORAL
  Filled 2019-01-13 (×4): qty 1

## 2019-01-13 MED ORDER — HYDROXYZINE HCL 25 MG PO TABS
25.0000 mg | ORAL_TABLET | Freq: Three times a day (TID) | ORAL | Status: DC | PRN
  Administered 2019-01-13 – 2019-01-16 (×8): 25 mg via ORAL
  Filled 2019-01-13 (×7): qty 1

## 2019-01-13 NOTE — Progress Notes (Addendum)
Patient denied SI and HI, contracts for safety.  Denied A/V hallucinations. Medication administered per MD orders.  Emotional support  And encouragement given patient. Safety maintained with 15 minute checks.  Patient stated her mind is clear, of sound mind, right this minute.  SI thoughts happen at night when she is under influence of alcohol.   Patient did not have a SI plan.  Patient wants to stop drinking for the sake of her 38 yr old daughter, but feels she needs special help.  Patient wants to be a good parent to her daughter and does not feel that she has any support from her family.  Patient has seen shadow objects just pass by her in the past, which this also happens at night.  Sometimes she hears mumblings, static at night while at home.  Once at home she was in a drunken state, turned over in bed, and saw her deceased father.  There are only casual drinkers in her family, not what she is experiencing.  Patient's best friend who does not drink alcohol and does not take drugs, is taking care of patient's 61 yr old daughter during this hospitalization.
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
## 2019-01-13 NOTE — ED Notes (Signed)
Stacey Hunter, patient accepted to [facility] Adult Unit,  Room 306 Bed 2,  Attending is Dr. Mallie Darting Arrival time is 2:30am Beckie Busing, RN, informed of acceptance Report [phone]

## 2019-01-13 NOTE — Progress Notes (Signed)
Patient presents with depressed/anxious/sad/tearful affect and behavior during admission interview and assessment. VS monitored and recorded. Skin check performed with Taffney MHT and revealed some tattoos. Contraband was not found. Patient was oriented to unit and schedule. Pt states "I am falling behind because of the COVID crisis. Money is scarce, and I've been drinking to cope. I was going to OD on pills, but I flushed them. I want to get better". Pt denies SI/HI/AVH at this time. PO fluids provided. Safety maintained. Rest encouraged.

## 2019-01-13 NOTE — Plan of Care (Signed)
Nurse discussed anxiety, depression and coping skills with patient.  
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
## 2019-01-13 NOTE — Progress Notes (Signed)
Adult Psychoeducational Group Note  Date:  01/13/2019 Time:  9:15 PM  Group Topic/Focus:  Wrap-Up Group:   The focus of this group is to help patients review their daily goal of treatment and discuss progress on daily workbooks.  Participation Level:  Minimal  Participation Quality:  Appropriate  Affect:  Appropriate  Cognitive:  Alert  Insight: Appropriate  Engagement in Group:  Engaged  Modes of Intervention:  Discussion  Additional Comments:  Pt rated her day 5/10. Her goal was to get better and she feels like she is moving in a positive direction with her goal.   Stacey Hunter 01/13/2019, 9:15 PM

## 2019-01-13 NOTE — Progress Notes (Signed)
[place] NOVEL CORONAVIRUS (COVID-19) DAILY CHECK-OFF SYMPTOMS - answer yes or no to each - every day NO YES  Have you had a fever in the past 24 hours?  . Fever (Temp > 37.80C / 100F) X   Have you had any of these symptoms in the past 24 hours? . New Cough .  Sore Throat  .  Shortness of Breath .  Difficulty Breathing .  Unexplained Body Aches   X   Have you had any one of these symptoms in the past 24 hours not related to allergies?   . Runny Nose .  Nasal Congestion .  Sneezing   X   If you have had runny nose, nasal congestion, sneezing in the past 24 hours, has it worsened?  X   EXPOSURES - check yes or no X   Have you traveled outside the state in the past 14 days?  X   Have you been in contact with someone with a confirmed diagnosis of COVID-19 or PUI in the past 14 days without wearing appropriate PPE?  X   Have you been living in the same home as a person with confirmed diagnosis of COVID-19 or a PUI (household contact)?    X   Have you been diagnosed with COVID-19?    X              What to do next: Answered NO to all: Answered YES to anything:   Proceed with unit schedule Follow the [facility] Inpatient Flowsheet.   

## 2019-01-13 NOTE — Progress Notes (Signed)
Stacey Hunter observe reading a book in her room. She denies SI/HI/AVH at present. Her chief c/o is withdrawal s/s. Pt is isolative to room. Support offered. Will continue with POC.

## 2019-01-13 NOTE — BHH Counselor (Signed)
CSW completed PSA with patient. Currently, she is not active with outpatient providers. However, she is open to following up at [facility] for medication management and therapy services.   Merland Holness S. [place], [place], MSW [facility]: Child and Adolescent  [phone]

## 2019-01-14 DIAGNOSIS — R45851 Suicidal ideations: Secondary | ICD-10-CM

## 2019-01-14 LAB — COMPREHENSIVE METABOLIC PANEL
ALT: 41 U/L (ref 0–44)
AST: 40 U/L (ref 15–41)
Albumin: 3.8 g/dL (ref 3.5–5.0)
Alkaline Phosphatase: 58 U/L (ref 38–126)
Anion gap: 11 (ref 5–15)
BUN: 14 mg/dL (ref 6–20)
CO2: 27 mmol/L (ref 22–32)
Calcium: 8.8 mg/dL — ABNORMAL LOW (ref 8.9–10.3)
Chloride: 100 mmol/L (ref 98–111)
Creatinine, Ser: 0.7 mg/dL (ref 0.44–1.00)
GFR calc Af Amer: >60 mL/min (ref >60)
GFR calc non Af Amer: >60 mL/min (ref >60)
Glucose, Bld: 88 mg/dL (ref 70–99)
Potassium: 3.3 mmol/L — ABNORMAL LOW (ref 3.5–5.1)
Sodium: 138 mmol/L (ref 135–145)
Total Bilirubin: 0.8 mg/dL (ref 0.3–1.2)
Total Protein: 6.9 g/dL (ref 6.5–8.1)

## 2019-01-14 MED ORDER — METRONIDAZOLE 0.75 % EX GEL
Freq: Two times a day (BID) | CUTANEOUS | Status: DC
  Administered 2019-01-14 – 2019-01-16 (×5): via TOPICAL
  Filled 2019-01-14: qty 45

## 2019-01-14 MED ORDER — METOPROLOL TARTRATE 50 MG PO TABS
50.0000 mg | ORAL_TABLET | Freq: Two times a day (BID) | ORAL | Status: DC
  Administered 2019-01-14 – 2019-01-17 (×6): 50 mg via ORAL
  Filled 2019-01-14 (×8): qty 1

## 2019-01-14 NOTE — Progress Notes (Signed)
Recreation Therapy Notes  Date:  6.19.20 Time: 0930 Location: [address] Dayroom  Group Topic: Stress Management  Goal Area(s) Addresses:  Patient will identify positive stress management techniques. Patient will identify benefits of using stress management post d/c.  Intervention: Stress Management  Activity :  Progressive Muscle Relaxation.  LRT introduced the stress management technique of progressive muscle relaxation.  LRT lead patients in tensing each muscle group individually then releasing it.  Patients were to follow along as LRT lead them through the exercise.  Education:  Stress Management, Discharge Planning.   Education Outcome: Acknowledges Education  Clinical Observations/Feedback: Pt did not attend group.     Victorino Sparrow, LRT/CTRS         Ria Comment, Hartley Urton A 01/14/2019 11:09 AM

## 2019-01-14 NOTE — Tx Team (Signed)
Initial Treatment Plan 01/14/2019 1:13 AM Stacey Hunter VCB:[id number]    PATIENT STRESSORS: Medication change or noncompliance Substance abuse   PATIENT STRENGTHS: Ability for insight Average or above average intelligence General fund of knowledge Motivation for treatment/growth   PATIENT IDENTIFIED PROBLEMS: "Substance abuse"  "Depression"  "At risk for suicide"                  DISCHARGE CRITERIA:  Ability to meet basic life and health needs Improved stabilization in mood, thinking, and/or behavior Verbal commitment to aftercare and medication compliance Withdrawal symptoms are absent or subacute and managed without 24-hour nursing intervention  PRELIMINARY DISCHARGE PLAN: Attend PHP/IOP Outpatient therapy  PATIENT/FAMILY INVOLVEMENT: This treatment plan has been presented to and reviewed with the patient, Stacey Hunter.The patient have been given the opportunity to ask questions and make suggestions.  Lonia Skinner, RN 01/14/2019, 1:13 AM

## 2019-01-14 NOTE — Progress Notes (Signed)
Pt rates depression 4/1, anxiety 7/10 and, hopelessness 5/10. Pt denies SI/HI. Pt reported fair sleep last night. Pt reports withdrawal symptoms of diarrhea and a runny nose. Pt stated goal "to be able to be happy." pt compliant with taking meds and denies any side effects.  Medications reviewed with pt. Verbal support provided. Pt encouraged to attend groups. 15 minute checks performed for safety.   Pt compliant with tx plan.  [place] NOVEL CORONAVIRUS (COVID-19) DAILY CHECK-OFF SYMPTOMS - answer yes or no to each - every day NO YES  Have you had a fever in the past 24 hours?  . Fever (Temp > 37.80C / 100F) X   Have you had any of these symptoms in the past 24 hours? . New Cough .  Sore Throat  .  Shortness of Breath .  Difficulty Breathing .  Unexplained Body Aches   X   Have you had any one of these symptoms in the past 24 hours not related to allergies?   . Runny Nose .  Nasal Congestion .  Sneezing   X   If you have had runny nose, nasal congestion, sneezing in the past 24 hours, has it worsened?  X   EXPOSURES - check yes or no X   Have you traveled outside the state in the past 14 days?  X   Have you been in contact with someone with a confirmed diagnosis of COVID-19 or PUI in the past 14 days without wearing appropriate PPE?  X   Have you been living in the same home as a person with confirmed diagnosis of COVID-19 or a PUI (household contact)?    X   Have you been diagnosed with COVID-19?    X              What to do next: Answered NO to all: Answered YES to anything:   Proceed with unit schedule Follow the [facility] Inpatient Flowsheet.

## 2019-01-14 NOTE — Progress Notes (Signed)
[facility] MD Progress Note  01/14/2019 9:24 AM Stacey Hunter  MRN:  [id number] Subjective:  Patient is a 38 year old female with a past psychiatric history significant for alcohol dependence, substance-induced mood disorder and alcohol withdrawal who presented to the [facility] emergency department on 01/12/2019 with suicidal ideation. The patient stated that she has been on a "a drinking binge". This has been going on for approximately 3 weeks. The patient stated she was drinking 6-24 ounce beers a day.  Objective: Patient is seen and examined.  Patient is a 38 year old female with the above-stated past psychiatric history who is seen in follow-up.  She is doing better today.  Much less tremulous, less labile emotions.  Her blood pressure remains mildly elevated at 129/96, and she remains tachycardic with a rate of 116.  Her EKG on admission revealed a sinus tachycardia with a left ventricular fascicular block.  Laboratories were significant for a mildly elevated white blood cell count at 13.  Her TSH was elevated at 5.491.  Urine drug screen was positive for benzodiazepines as well as marijuana.  Blood alcohol on admission was less than 10.  Her last CIWA was 1.  She slept 6.5 hours last night.  Principal Problem: <principal problem not specified> Diagnosis: Active Problems:   Severe recurrent major depression without psychotic features ([place])  Total Time spent with patient: 15 minutes  Past Psychiatric History: See admission H&P  Past Medical History: History reviewed. No pertinent past medical history. History reviewed. No pertinent surgical history. Family History: History reviewed. No pertinent family history. Family Psychiatric  History: See admission H&P Social History:  Social History   Substance and Sexual Activity  Alcohol Use Yes   Comment: daily      Social History   Substance and Sexual Activity  Drug Use Yes  . Types: Marijuana    Social History    Socioeconomic History  . Marital status: Single    Spouse name: Not on file  . Number of children: Not on file  . Years of education: Not on file  . Highest education level: Not on file  Occupational History  . Not on file  Social Needs  . Financial resource strain: Not on file  . Food insecurity    Worry: Not on file    Inability: Not on file  . Transportation needs    Medical: Not on file    Non-medical: Not on file  Tobacco Use  . Smoking status: Current Every Day Smoker    Packs/day: 0.00  . Smokeless tobacco: Never Used  Substance and Sexual Activity  . Alcohol use: Yes    Comment: daily   . Drug use: Yes    Types: Marijuana  . Sexual activity: Not on file  Lifestyle  . Physical activity    Days per week: Not on file    Minutes per session: Not on file  . Stress: Not on file  Relationships  . Social Herbalist on phone: Not on file    Gets together: Not on file    Attends religious service: Not on file    Active member of club or organization: Not on file    Attends meetings of clubs or organizations: Not on file    Relationship status: Not on file  Other Topics Concern  . Not on file  Social History Narrative  . Not on file   Additional Social History:  Sleep: Good  Appetite:  Fair  Current Medications: Current Facility-Administered Medications  Medication Dose Route Frequency Provider Last Rate Last Dose  . acetaminophen (TYLENOL) tablet 650 mg  650 mg Oral Q6H PRN Nira ConnBerry, Jason A, NP   650 mg at 01/14/19 0819  . alum & mag hydroxide-simeth (MAALOX/MYLANTA) 200-200-20 MG/5ML suspension 30 mL  30 mL Oral Q4H PRN Nira ConnBerry, Jason A, NP      . cloNIDine (CATAPRES) tablet 0.1 mg  0.1 mg Oral TID PRN Antonieta Pertlary, Juleah Paradise Lawson, MD      . folic acid (FOLVITE) tablet 1 mg  1 mg Oral Daily Antonieta Pertlary, Annalaura Sauseda Lawson, MD   1 mg at 01/14/19 0820  . hydrOXYzine (ATARAX/VISTARIL) tablet 25 mg  25 mg Oral TID PRN Jackelyn PolingBerry, Jason A, NP   25 mg  at 01/14/19 [id number]  . loperamide (IMODIUM) capsule 2-4 mg  2-4 mg Oral PRN Nira ConnBerry, Jason A, NP   4 mg at 01/13/19 1537  . LORazepam (ATIVAN) tablet 1 mg  1 mg Oral Q6H PRN Nira ConnBerry, Jason A, NP   1 mg at 01/13/19 2139  . magnesium hydroxide (MILK OF MAGNESIA) suspension 30 mL  30 mL Oral Daily PRN Nira ConnBerry, Jason A, NP      . metoprolol tartrate (LOPRESSOR) tablet 25 mg  25 mg Oral BID Antonieta Pertlary, Kiana Hollar Lawson, MD   25 mg at 01/14/19 0820  . multivitamin with minerals tablet 1 tablet  1 tablet Oral Daily Nira ConnBerry, Jason A, NP   1 tablet at 01/14/19 0820  . nicotine polacrilex (NICORETTE) gum 2 mg  2 mg Oral PRN Antonieta Pertlary, Camela Wich Lawson, MD   2 mg at 01/13/19 1528  . ondansetron (ZOFRAN-ODT) disintegrating tablet 4 mg  4 mg Oral Q6H PRN Nira ConnBerry, Jason A, NP      . sertraline (ZOLOFT) tablet 25 mg  25 mg Oral Daily Antonieta Pertlary, Deaundra Kutzer Lawson, MD   25 mg at 01/14/19 0820  . thiamine (VITAMIN B-1) tablet 100 mg  100 mg Oral Daily Nira ConnBerry, Jason A, NP   100 mg at 01/14/19 0820  . traZODone (DESYREL) tablet 50 mg  50 mg Oral QHS PRN Jackelyn PolingBerry, Jason A, NP   50 mg at 01/13/19 2108    Lab Results:  Results for orders placed or performed during the hospital encounter of 01/13/19 (from the past 48 hour(s))  Hemoglobin A1c     Status: None   Collection Time: 01/13/19  6:36 PM  Result Value Ref Range   Hgb A1c MFr Bld 5.0 4.8 - 5.6 %    Comment: (NOTE) Pre diabetes:          5.7%-6.4% Diabetes:              >6.4% Glycemic control for   <7.0% adults with diabetes    Mean Plasma Glucose 96.8 mg/dL    Comment: Performed at [facility], [address] [place], [place] [address]  Lipid panel     Status: Abnormal   Collection Time: 01/13/19  6:36 PM  Result Value Ref Range   Cholesterol 229 (H) 0 - 200 mg/dL   Triglycerides 85 <409<150 mg/dL   HDL 98 >81>40 mg/dL   Total CHOL/HDL Ratio 2.3 RATIO   VLDL 17 0 - 40 mg/dL   LDL Cholesterol 191114 (H) 0 - 99 mg/dL    Comment:        Total Cholesterol/HDL:CHD Risk Coronary Heart Disease Risk  Table  Men   Women  1/2 Average Risk   3.4   3.3  Average Risk       5.0   4.4  2 X Average Risk   9.6   7.1  3 X Average Risk  23.4   11.0        Use the calculated Patient Ratio above and the CHD Risk Table to determine the patient's CHD Risk.        ATP III CLASSIFICATION (LDL):  <100     mg/dL   Optimal  [id number]  mg/dL   Near or Above                    Optimal  130-159  mg/dL   Borderline  [id number]  mg/dL   High  >956>190     mg/dL   Very High Performed at [facility], [address] [place], [place] [address]   TSH     Status: Abnormal   Collection Time: 01/13/19  6:36 PM  Result Value Ref Range   TSH 5.491 (H) 0.350 - 4.500 uIU/mL    Comment: Performed by a 3rd Generation assay with a functional sensitivity of <=0.01 uIU/mL. Performed at [facility], [address] [place], [place] [address]   Comprehensive metabolic panel     Status: Abnormal   Collection Time: 01/14/19  6:30 AM  Result Value Ref Range   Sodium 138 135 - 145 mmol/L   Potassium 3.3 (L) 3.5 - 5.1 mmol/L   Chloride 100 98 - 111 mmol/L   CO2 27 22 - 32 mmol/L   Glucose, Bld 88 70 - 99 mg/dL   BUN 14 6 - 20 mg/dL   Creatinine, Ser 6.960.70 0.44 - 1.00 mg/dL   Calcium 8.8 (L) 8.9 - 10.3 mg/dL   Total Protein 6.9 6.5 - 8.1 g/dL   Albumin 3.8 3.5 - 5.0 g/dL   AST 40 15 - 41 U/L   ALT 41 0 - 44 U/L   Alkaline Phosphatase 58 38 - 126 U/L   Total Bilirubin 0.8 0.3 - 1.2 mg/dL   GFR calc non Af Amer >60 >60 mL/min   GFR calc Af Amer >60 >60 mL/min   Anion gap 11 5 - 15    Comment: Performed at [facility], [address] [place], [place] [address]    Blood Alcohol level:  Lab Results  Component Value Date   ETH <10 01/12/2019   ETH 452 (HH) 08/15/2014    Metabolic Disorder Labs: Lab Results  Component Value Date   HGBA1C 5.0 01/13/2019   MPG 96.8 01/13/2019   No results found for: PROLACTIN Lab Results  Component  Value Date   CHOL 229 (H) 01/13/2019   TRIG 85 01/13/2019   HDL 98 01/13/2019   CHOLHDL 2.3 01/13/2019   VLDL 17 01/13/2019   LDLCALC 114 (H) 01/13/2019    Physical Findings: AIMS:  , ,  ,  ,    CIWA:  CIWA-Ar Total: 1 COWS:     Musculoskeletal: Strength & Muscle Tone: within normal limits Gait & Station: normal Patient leans: N/A  Psychiatric Specialty Exam: Physical Exam  Nursing note and vitals reviewed. Constitutional: She is oriented to person, place, and time. She appears well-developed and well-nourished.  HENT:  Head: Normocephalic and atraumatic.  Respiratory: Effort normal.  Neurological: She is alert and oriented to person, place, and time.    ROS  Blood pressure (!) 129/96, pulse Marland Kitchen(!)  116, temperature 97.8 F (36.6 C), temperature source Oral, resp. rate 18, SpO2 99 %.There is no height or weight on file to calculate BMI.  General Appearance: Casual  Eye Contact:  Fair  Speech:  Normal Rate  Volume:  Normal  Mood:  Anxious  Affect:  Congruent  Thought Process:  Coherent and Descriptions of Associations: Intact  Orientation:  Full (Time, Place, and Person)  Thought Content:  Logical  Suicidal Thoughts:  No  Homicidal Thoughts:  No  Memory:  Immediate;   Fair Recent;   Fair Remote;   Fair  Judgement:  Intact  Insight:  Fair  Psychomotor Activity:  Increased  Concentration:  Concentration: Fair and Attention Span: Fair  Recall:  [employer]  Fund of Knowledge:  Fair  Language:  Good  Akathisia:  Negative  Handed:  Right  AIMS (if indicated):     Assets:  Desire for Improvement Resilience  ADL's:  Intact  Cognition:  WNL  Sleep:  Number of Hours: 6.5     Treatment Plan Summary: Daily contact with patient to assess and evaluate symptoms and progress in treatment, Medication management and Plan : Patient is seen and examined.  Patient is a 38 year old female with the above-stated past psychiatric history who is seen in follow-up.   Diagnosis: #1 alcohol  dependence, #2 alcohol withdrawal, #3 substance-induced mood disorder  Patient is seen in follow-up.  She is doing better today.  Much less tremulous, much less labile.  No change in her current medications.  I am going to start MetroGel twice daily for her rosacea.  We will continue to monitor for withdrawal symptoms. 1.  Continue clonidine 0.1 mg p.o. 3 times daily as needed systolic blood pressure greater than 160. 2.  Continue folic acid 1 mg p.o. daily for nutritional supplementation. 3.  Continue hydroxyzine 25 mg p.o. 3 times daily as needed anxiety. 4.  Continue lorazepam 1 mg p.o. every 6 hours PRN a CIWA greater than 10. 5.  Increase metoprolol tartrate to 50 mg p.o. twice daily for hypertension and tachycardia. 6.  Start MetroGel topically twice daily for rosacea. 7.  Continue multivitamin 1 tablet p.o. daily for nutritional supplementation. 8.  Continue Zoloft 25 mg p.o. daily for anxiety and depression. 9.  Continue thiamine 100 mg p.o. daily for nutritional supplementation. 10.  Continue trazodone 50 mg p.o. nightly as needed insomnia. 11.  Disposition planning-in progress.  Antonieta PertGreg Lawson Twylla Arceneaux, MD 01/14/2019, 9:24 AM

## 2019-01-14 NOTE — Tx Team (Signed)
Interdisciplinary Treatment and Diagnostic Plan Update  01/14/2019 Time of Session: 9:00am Stacey Hunter MRN: [id number]  Principal Diagnosis: <principal problem not specified>  Secondary Diagnoses: Active Problems:   Severe recurrent major depression without psychotic features (H[facility])   Current Medications:  Current Facility-Administered Medications  Medication Dose Route Frequency Provider Last Rate Last Dose  . acetaminophen (TYLENOL) tablet 650 mg  650 mg Oral Q6H PRN Lindon Romp A, NP   650 mg at 01/14/19 0819  . alum & mag hydroxide-simeth (MAALOX/MYLANTA) 200-200-20 MG/5ML suspension 30 mL  30 mL Oral Q4H PRN Lindon Romp A, NP      . cloNIDine (CATAPRES) tablet 0.1 mg  0.1 mg Oral TID PRN Sharma Covert, MD      . folic acid (FOLVITE) tablet 1 mg  1 mg Oral Daily Sharma Covert, MD   1 mg at 01/14/19 0820  . hydrOXYzine (ATARAX/VISTARIL) tablet 25 mg  25 mg Oral TID PRN Rozetta Nunnery, NP   25 mg at 01/14/19 4098  . loperamide (IMODIUM) capsule 2-4 mg  2-4 mg Oral PRN Lindon Romp A, NP   4 mg at 01/13/19 1537  . LORazepam (ATIVAN) tablet 1 mg  1 mg Oral Q6H PRN Lindon Romp A, NP   1 mg at 01/13/19 2139  . magnesium hydroxide (MILK OF MAGNESIA) suspension 30 mL  30 mL Oral Daily PRN Lindon Romp A, NP      . metoprolol tartrate (LOPRESSOR) tablet 50 mg  50 mg Oral BID Sharma Covert, MD      . metroNIDAZOLE (METROGEL) 0.75 % gel   Topical BID Sharma Covert, MD      . multivitamin with minerals tablet 1 tablet  1 tablet Oral Daily Lindon Romp A, NP   1 tablet at 01/14/19 0820  . nicotine polacrilex (NICORETTE) gum 2 mg  2 mg Oral PRN Sharma Covert, MD   2 mg at 01/13/19 1528  . ondansetron (ZOFRAN-ODT) disintegrating tablet 4 mg  4 mg Oral Q6H PRN Lindon Romp A, NP      . sertraline (ZOLOFT) tablet 25 mg  25 mg Oral Daily Sharma Covert, MD   25 mg at 01/14/19 0820  . thiamine (VITAMIN B-1) tablet 100 mg  100 mg Oral Daily Lindon Romp A, NP   100  mg at 01/14/19 0820  . traZODone (DESYREL) tablet 50 mg  50 mg Oral QHS PRN Rozetta Nunnery, NP   50 mg at 01/13/19 2108   PTA Medications: Medications Prior to Admission  Medication Sig Dispense Refill Last Dose  . chlordiazePOXIDE (LIBRIUM) 25 MG capsule Take 2-4 capsules (50-100 mg total) by mouth 3 (three) times daily as needed for withdrawal. Take 2-4 capsules four times daily on day one, take 2-4 capsules three times daily on day 2, Take 2-4 capsules twice daily on day 3, take 2-4 capsules at bedtime on day four (Patient not taking: Reported on 01/13/2019) 30 capsule 0 Not Taking at Unknown time  . DULoxetine (CYMBALTA) 60 MG capsule Take 60 mg by mouth daily.   Not Taking at Unknown time  . furosemide (LASIX) 20 MG tablet Take 20 mg by mouth daily.    Not Taking at Unknown time  . gabapentin (NEURONTIN) 100 MG capsule Take 100 mg by mouth 2 (two) times daily.   Not Taking at Unknown time    Patient Stressors: Medication change or noncompliance Substance abuse  Patient Strengths: Ability for insight Average or above average  intelligence General fund of knowledge Motivation for treatment/growth  Treatment Modalities: Medication Management, Group therapy, Case management,  1 to 1 session with clinician, Psychoeducation, Recreational therapy.   Physician Treatment Plan for Primary Diagnosis: <principal problem not specified> Long Term Goal(s): Improvement in symptoms so as ready for discharge Improvement in symptoms so as ready for discharge   Short Term Goals: Ability to identify changes in lifestyle to reduce recurrence of condition will improve Ability to verbalize feelings will improve Ability to disclose and discuss suicidal ideas Ability to demonstrate self-control will improve Ability to identify and develop effective coping behaviors will improve Ability to maintain clinical measurements within normal limits will improve Ability to identify triggers associated with  substance abuse/mental health issues will improve Ability to identify changes in lifestyle to reduce recurrence of condition will improve Ability to verbalize feelings will improve Ability to disclose and discuss suicidal ideas Ability to demonstrate self-control will improve Ability to identify and develop effective coping behaviors will improve Ability to maintain clinical measurements within normal limits will improve Ability to identify triggers associated with substance abuse/mental health issues will improve  Medication Management: Evaluate patient's response, side effects, and tolerance of medication regimen.  Therapeutic Interventions: 1 to 1 sessions, Unit Group sessions and Medication administration.  Evaluation of Outcomes: Not Met  Physician Treatment Plan for Secondary Diagnosis: Active Problems:   Severe recurrent major depression without psychotic features ([place])  Long Term Goal(s): Improvement in symptoms so as ready for discharge Improvement in symptoms so as ready for discharge   Short Term Goals: Ability to identify changes in lifestyle to reduce recurrence of condition will improve Ability to verbalize feelings will improve Ability to disclose and discuss suicidal ideas Ability to demonstrate self-control will improve Ability to identify and develop effective coping behaviors will improve Ability to maintain clinical measurements within normal limits will improve Ability to identify triggers associated with substance abuse/mental health issues will improve Ability to identify changes in lifestyle to reduce recurrence of condition will improve Ability to verbalize feelings will improve Ability to disclose and discuss suicidal ideas Ability to demonstrate self-control will improve Ability to identify and develop effective coping behaviors will improve Ability to maintain clinical measurements within normal limits will improve Ability to identify triggers associated  with substance abuse/mental health issues will improve     Medication Management: Evaluate patient's response, side effects, and tolerance of medication regimen.  Therapeutic Interventions: 1 to 1 sessions, Unit Group sessions and Medication administration.  Evaluation of Outcomes: Not Met   RN Treatment Plan for Primary Diagnosis: <principal problem not specified> Long Term Goal(s): Knowledge of disease and therapeutic regimen to maintain health will improve  Short Term Goals: Ability to identify and develop effective coping behaviors will improve and Compliance with prescribed medications will improve  Medication Management: RN will administer medications as ordered by provider, will assess and evaluate patient's response and provide education to patient for prescribed medication. RN will report any adverse and/or side effects to prescribing provider.  Therapeutic Interventions: 1 on 1 counseling sessions, Psychoeducation, Medication administration, Evaluate responses to treatment, Monitor vital signs and CBGs as ordered, Perform/monitor CIWA, COWS, AIMS and Fall Risk screenings as ordered, Perform wound care treatments as ordered.  Evaluation of Outcomes: Not Met   LCSW Treatment Plan for Primary Diagnosis: <principal problem not specified> Long Term Goal(s): Safe transition to appropriate next level of care at discharge, Engage patient in therapeutic group addressing interpersonal concerns.  Short Term Goals: Engage patient in aftercare  planning with referrals and resources, Identify triggers associated with mental health/substance abuse issues and Increase skills for wellness and recovery  Therapeutic Interventions: Assess for all discharge needs, 1 to 1 time with Social worker, Explore available resources and support systems, Assess for adequacy in community support network, Educate family and significant other(s) on suicide prevention, Complete Psychosocial Assessment, Interpersonal  group therapy.  Evaluation of Outcomes: Not Met   Progress in Treatment: Attending groups: Yes. Participating in groups: Yes. Taking medication as prescribed: Yes. Toleration medication: Yes. Family/Significant other contact made: No, will contact:  declines consents. SPE with patient. Patient understands diagnosis: Yes. Discussing patient identified problems/goals with staff: Yes. Medical problems stabilized or resolved: Yes. Denies suicidal/homicidal ideation: Yes. Issues/concerns per patient self-inventory: Yes.  New problem(s) identified: Yes, Describe:  limited social supports  New Short Term/Long Term Goal(s): detox, medication management for mood stabilization; elimination of SI thoughts; development of comprehensive mental wellness/sobriety plan.  Patient Goals:    Discharge Plan or Barriers: Discharge home, follow up with [facility] for outpatient.  Reason for Continuation of Hospitalization: Anxiety Depression Withdrawal symptoms  Estimated Length of Stay: 3-5 days  Attendees: Patient: 01/14/2019 10:59 AM  Physician:  01/14/2019 10:59 AM  Nursing:  01/14/2019 10:59 AM  RN Care Manager: 01/14/2019 10:59 AM  Social Worker: Yaeli Acre, [place] 01/14/2019 10:59 AM  Recreational Therapist:  01/14/2019 10:59 AM  Other:  01/14/2019 10:59 AM  Other:  01/14/2019 10:59 AM  Other: 01/14/2019 10:59 AM    Scribe for Treatment Team: Joellen Jersey, LCSWA 01/14/2019 10:59 AM

## 2019-01-15 MED ORDER — SERTRALINE HCL 50 MG PO TABS
50.0000 mg | ORAL_TABLET | Freq: Every day | ORAL | Status: DC
  Administered 2019-01-16 – 2019-01-17 (×2): 50 mg via ORAL
  Filled 2019-01-15 (×4): qty 1

## 2019-01-15 NOTE — Progress Notes (Signed)
D Pt is observed at the med window this am. SHe is flat, dperessed, yet cooperative and makes conversation easily with this Probation officer. She says " You know, I'm just a loner...ibuprofen don't much care to be around most of these people....their conversaiton is not what I wan to listen to and I can busy myslef with journaling and / or reading in my room and be much more comfortable"     A She completed her daily assessment and on this she wrote she denied having SI today and she rated her depression, hopelessness and anxiety " 4/3/4/"< respecitvely.      R Safety in place.

## 2019-01-15 NOTE — Progress Notes (Signed)
The patient verbalized in group that she had a good talk with her daughter. Nothing else pertaining to her day was shared in group. Her goal for tomorrow is to have a better day.

## 2019-01-15 NOTE — [facility] Group Notes (Signed)
LCSW Group Therapy Note  01/15/2019     11:15AM-12:00PM  Type of Therapy and Topic:  Group Therapy:  Self Sabotage  Participation Level:  Active        . Description of Group:  Today's process group focused on the topic of Self Sabotage, what this is, and what methods of self-sabotage patients in the group have found themselves using.  Commonalities were then pointed out and the group explored possible benefits of choosing healthier coping skills.  Patients were asked to rate both their commitment to change and their confidence in their ability to change from 1 (lowest) to 10 (highest), then asked about their answers in order to provoke change talk.   Therapeutic Goals 1. Patient will be able to identify their typical methods of self sabotage. 2. Patient will list reasons they engage in these destructive behaviors, and harm that comes from them 3. Patient will be able verbalize the costs and benefits of drinking/drugging versus making the choice to change 4. Patient will rate their commitment to change and confidence about their ability to change, and will be guided to change talk.  Summary of Patient Progress: During group, patient expressed her typical manner of self-sabotage is drinking while justifying "it is only one more drink, or one night because (often) of boredom  She said she has just come of a 10-day binge and talked about her pattern of quitting being like a yoyo.  Her commitment to change was rated at a 10 because she is tired of living this way and thinks sobriety is really a better way and confidence in her ability to change was rated 8 because she has some self doubt about her ability and needs to see some progress before believing in herself all the way.  She said she is going to go to an [place] meeting every day when she first gets out of the hospital.  Therapeutic Modalities [facility] [facility] [place], LCSW 01/15/2019, 12:55 PM

## 2019-01-15 NOTE — Progress Notes (Signed)
DAR NOTE: Patient presents with calm affect and pleasant mood. Pt reports good day, good appetite. Pt complained of lower left back pain, denies SI/HI auditory and visual hallucinations.  Maintained on routine safety checks.  Medications given as prescribed.  Support and encouragement offered as needed.  Will continue to monitor.

## 2019-01-15 NOTE — Progress Notes (Signed)
[facility] MD Progress Note  01/15/2019 12:41 PM Stacey Hunter  MRN:  [id number]   Subjective: Stacey Hunter  reported" I am feeling a lot better today than I have in a while."  Evaluation: Hanin observed resting in bed reading a book.  Patient is awake,alert and oriented x3.  She is currently denying suicidal or homicidal ideations.  Denies auditory visual hallucinations.  Reports her depression has improved since her admission.  Patient is rating her depression 3 out of 10 with 10 being the worst.  Reports today's group session helped improved her" outlook on life".  She states she feels "energized".  Reports a good appetite.  States she is resting well throughout the night.  Discussed titration to Zoloft, patient was receptive to plan.  She denies any medication side effects i.e. headaches nausea vomiting or dizziness.  Denies alcohol withdrawal symptoms during this assessment.  As she reports nausea has resolved a few days ago.  Chart review last blood pressure 146/80 states downward trend.  Denies headaches, blurred vision or dizziness.  Recent adjustment to metoprolol.  Continue to monitor.  Support, encouragement and reassurance was provided.   Principal Problem: <principal problem not specified> Diagnosis: Active Problems:   Severe recurrent major depression without psychotic features ([place])  Total Time spent with patient: 15 minutes  Past Psychiatric History: See admission H&P  Past Medical History: History reviewed. No pertinent past medical history. History reviewed. No pertinent surgical history. Family History: History reviewed. No pertinent family history. Family Psychiatric  History: See admission H&P Social History:  Social History   Substance and Sexual Activity  Alcohol Use Yes   Comment: daily      Social History   Substance and Sexual Activity  Drug Use Yes  . Types: Marijuana    Social History   Socioeconomic History  . Marital status: Single    Spouse name:  Not on file  . Number of children: Not on file  . Years of education: Not on file  . Highest education level: Not on file  Occupational History  . Not on file  Social Needs  . Financial resource strain: Not on file  . Food insecurity    Worry: Not on file    Inability: Not on file  . Transportation needs    Medical: Not on file    Non-medical: Not on file  Tobacco Use  . Smoking status: Current Every Day Smoker    Packs/day: 0.00  . Smokeless tobacco: Never Used  Substance and Sexual Activity  . Alcohol use: Yes    Comment: daily   . Drug use: Yes    Types: Marijuana  . Sexual activity: Not on file  Lifestyle  . Physical activity    Days per week: Not on file    Minutes per session: Not on file  . Stress: Not on file  Relationships  . Social Herbalist on phone: Not on file    Gets together: Not on file    Attends religious service: Not on file    Active member of club or organization: Not on file    Attends meetings of clubs or organizations: Not on file    Relationship status: Not on file  Other Topics Concern  . Not on file  Social History Narrative  . Not on file   Additional Social History:  Sleep: Good  Appetite:  Fair  Current Medications: Current Facility-Administered Medications  Medication Dose Route Frequency Provider Last Rate Last Dose  . acetaminophen (TYLENOL) tablet 650 mg  650 mg Oral Q6H PRN Nira ConnBerry, Jason A, NP   650 mg at 01/14/19 2054  . alum & mag hydroxide-simeth (MAALOX/MYLANTA) 200-200-20 MG/5ML suspension 30 mL  30 mL Oral Q4H PRN Nira ConnBerry, Jason A, NP      . cloNIDine (CATAPRES) tablet 0.1 mg  0.1 mg Oral TID PRN Antonieta Pertlary, Greg Lawson, MD      . folic acid (FOLVITE) tablet 1 mg  1 mg Oral Daily Antonieta Pertlary, Greg Lawson, MD   1 mg at 01/15/19 [id number]  . hydrOXYzine (ATARAX/VISTARIL) tablet 25 mg  25 mg Oral TID PRN Jackelyn PolingBerry, Jason A, NP   25 mg at 01/14/19 2054  . loperamide (IMODIUM) capsule 2-4 mg  2-4 mg Oral  PRN Nira ConnBerry, Jason A, NP   4 mg at 01/13/19 1537  . LORazepam (ATIVAN) tablet 1 mg  1 mg Oral Q6H PRN Nira ConnBerry, Jason A, NP   1 mg at 01/13/19 2139  . magnesium hydroxide (MILK OF MAGNESIA) suspension 30 mL  30 mL Oral Daily PRN Nira ConnBerry, Jason A, NP      . metoprolol tartrate (LOPRESSOR) tablet 50 mg  50 mg Oral BID Antonieta Pertlary, Greg Lawson, MD   50 mg at 01/15/19 0916  . metroNIDAZOLE (METROGEL) 0.75 % gel   Topical BID Antonieta Pertlary, Greg Lawson, MD      . multivitamin with minerals tablet 1 tablet  1 tablet Oral Daily Nira ConnBerry, Jason A, NP   1 tablet at 01/15/19 0916  . nicotine polacrilex (NICORETTE) gum 2 mg  2 mg Oral PRN Antonieta Pertlary, Greg Lawson, MD   2 mg at 01/13/19 1528  . ondansetron (ZOFRAN-ODT) disintegrating tablet 4 mg  4 mg Oral Q6H PRN Jackelyn PolingBerry, Jason A, NP      . Melene Muller[START ON 01/16/2019] sertraline (ZOLOFT) tablet 50 mg  50 mg Oral Daily Oneta RackLewis, Jamaree Hosier N, NP      . thiamine (VITAMIN B-1) tablet 100 mg  100 mg Oral Daily Nira ConnBerry, Jason A, NP   100 mg at 01/15/19 0916  . traZODone (DESYREL) tablet 50 mg  50 mg Oral QHS PRN Jackelyn PolingBerry, Jason A, NP   50 mg at 01/13/19 2108    Lab Results:  Results for orders placed or performed during the hospital encounter of 01/13/19 (from the past 48 hour(s))  Hemoglobin A1c     Status: None   Collection Time: 01/13/19  6:36 PM  Result Value Ref Range   Hgb A1c MFr Bld 5.0 4.8 - 5.6 %    Comment: (NOTE) Pre diabetes:          5.7%-6.4% Diabetes:              >6.4% Glycemic control for   <7.0% adults with diabetes    Mean Plasma Glucose 96.8 mg/dL    Comment: Performed at [facility], [address] [place], [place] [address]  Lipid panel     Status: Abnormal   Collection Time: 01/13/19  6:36 PM  Result Value Ref Range   Cholesterol 229 (H) 0 - 200 mg/dL   Triglycerides 85 <308<150 mg/dL   HDL 98 >65>40 mg/dL   Total CHOL/HDL Ratio 2.3 RATIO   VLDL 17 0 - 40 mg/dL   LDL Cholesterol 784114 (H) 0 - 99 mg/dL    Comment:        Total Cholesterol/HDL:CHD  Risk Coronary Heart  Disease Risk Table                     Men   Women  1/2 Average Risk   3.4   3.3  Average Risk       5.0   4.4  2 X Average Risk   9.6   7.1  3 X Average Risk  23.4   11.0        Use the calculated Patient Ratio above and the CHD Risk Table to determine the patient's CHD Risk.        ATP III CLASSIFICATION (LDL):  <100     mg/dL   Optimal  [id number]  mg/dL   Near or Above                    Optimal  130-159  mg/dL   Borderline  [id number]  mg/dL   High  >332>190     mg/dL   Very High Performed at [facility], [address] [place], [place] [address]   TSH     Status: Abnormal   Collection Time: 01/13/19  6:36 PM  Result Value Ref Range   TSH 5.491 (H) 0.350 - 4.500 uIU/mL    Comment: Performed by a 3rd Generation assay with a functional sensitivity of <=0.01 uIU/mL. Performed at [facility], [address] [place], [place] [address]   Comprehensive metabolic panel     Status: Abnormal   Collection Time: 01/14/19  6:30 AM  Result Value Ref Range   Sodium 138 135 - 145 mmol/L   Potassium 3.3 (L) 3.5 - 5.1 mmol/L   Chloride 100 98 - 111 mmol/L   CO2 27 22 - 32 mmol/L   Glucose, Bld 88 70 - 99 mg/dL   BUN 14 6 - 20 mg/dL   Creatinine, Ser 6.300.70 0.44 - 1.00 mg/dL   Calcium 8.8 (L) 8.9 - 10.3 mg/dL   Total Protein 6.9 6.5 - 8.1 g/dL   Albumin 3.8 3.5 - 5.0 g/dL   AST 40 15 - 41 U/L   ALT 41 0 - 44 U/L   Alkaline Phosphatase 58 38 - 126 U/L   Total Bilirubin 0.8 0.3 - 1.2 mg/dL   GFR calc non Af Amer >60 >60 mL/min   GFR calc Af Amer >60 >60 mL/min   Anion gap 11 5 - 15    Comment: Performed at [facility], [address] [place], [place] [address]    Blood Alcohol level:  Lab Results  Component Value Date   ETH <10 01/12/2019   ETH 452 (HH) 08/15/2014    Metabolic Disorder Labs: Lab Results  Component Value Date   HGBA1C 5.0 01/13/2019   MPG 96.8 01/13/2019   No results found for: PROLACTIN Lab Results   Component Value Date   CHOL 229 (H) 01/13/2019   TRIG 85 01/13/2019   HDL 98 01/13/2019   CHOLHDL 2.3 01/13/2019   VLDL 17 01/13/2019   LDLCALC 114 (H) 01/13/2019    Physical Findings: AIMS:  , ,  ,  ,    CIWA:  CIWA-Ar Total: 1 COWS:     Musculoskeletal: Strength & Muscle Tone: within normal limits Gait & Station: normal Patient leans: N/A  Psychiatric Specialty Exam: Physical Exam  Nursing note and vitals reviewed. Constitutional: She is oriented to person, place, and time. She appears well-developed and well-nourished.  HENT:  Head: Normocephalic and  atraumatic.  Respiratory: Effort normal.  Neurological: She is alert and oriented to person, place, and time.    ROS  Blood pressure (!) 141/86, pulse 77, temperature 98.2 F (36.8 C), temperature source Oral, resp. rate 20, SpO2 100 %.There is no height or weight on file to calculate BMI.  General Appearance: Casual  Eye Contact:  Fair  Speech:  Normal Rate  Volume:  Normal  Mood:  Anxious  Affect:  Congruent  Thought Process:  Coherent and Descriptions of Associations: Intact  Orientation:  Full (Time, Place, and Person)  Thought Content:  Logical  Suicidal Thoughts:  No  Homicidal Thoughts:  No  Memory:  Immediate;   Fair Recent;   Fair Remote;   Fair  Judgement:  Intact  Insight:  Fair  Psychomotor Activity:  Normal  Concentration:  Concentration: Fair and Attention Span: Fair  Recall:  [employer]Fair  Fund of Knowledge:  Fair  Language:  Good  Akathisia:  Negative  Handed:  Right  AIMS (if indicated):     Assets:  Desire for Improvement Resilience  ADL's:  Intact  Cognition:  WNL  Sleep:  Number of Hours: 6     Treatment Plan Summary: Daily contact with patient to assess and evaluate symptoms and progress in treatment and Medication management   Continue her current treatment plan on 01/15/2019 as listed below except were noted  Major depressive disorder:   Continue hydroxyzine 25 mg p.o. 3 times daily  as needed anxiety.  Continue lorazepam 1 mg p.o. every 6 hours PRN a CIWA greater than 10. Continue metoprolol tartrate to 50 mg p.o. twice daily for hypertension and tachycardia. Continue MetroGel topically twice daily for rosacea. Continue continue multivitamin 1 tablet p.o. daily for nutritional supplementation.  Increased continue Zoloft 25 mg to  50mg  p.o. daily for anxiety and depression. Continue thiamine 100 mg p.o. daily for nutritional supplementation. Continue trazodone 50 mg p.o. nightly as needed insomnia.  Continue clonidine 0.1 mg p.o. 3 times daily as needed systolic blood pressure greater than 160. Continue folic acid 1 mg p.o. daily for nutritional supplementation.  CSW to continue working on discharge disposition Patient encouraged to participate within the therapeutic milieu  Oneta Rackanika N Khamille Beynon, NP 01/15/2019, 12:41 PM

## 2019-01-15 NOTE — Progress Notes (Signed)
[place] NOVEL CORONAVIRUS (COVID-19) DAILY CHECK-OFF SYMPTOMS - answer yes or no to each - every day NO YES  Have you had a fever in the past 24 hours?  . Fever (Temp > 37.80C / 100F) X   Have you had any of these symptoms in the past 24 hours? . New Cough .  Sore Throat  .  Shortness of Breath .  Difficulty Breathing .  Unexplained Body Aches   X   Have you had any one of these symptoms in the past 24 hours not related to allergies?   . Runny Nose .  Nasal Congestion .  Sneezing   X   If you have had runny nose, nasal congestion, sneezing in the past 24 hours, has it worsened?  X   EXPOSURES - check yes or no X   Have you traveled outside the state in the past 14 days?  X   Have you been in contact with someone with a confirmed diagnosis of COVID-19 or PUI in the past 14 days without wearing appropriate PPE?  X   Have you been living in the same home as a person with confirmed diagnosis of COVID-19 or a PUI (household contact)?    X   Have you been diagnosed with COVID-19?    X              What to do next: Answered NO to all: Answered YES to anything:   Proceed with unit schedule Follow the [facility] Inpatient Flowsheet.   

## 2019-01-15 NOTE — [facility] Group Notes (Signed)
[facility] Group Notes:  (Nursing)  Date:  01/15/2019  Time: 130 PM Type of Therapy:  Nurse Education  Participation Level:  Active  Participation Quality:  Appropriate and Attentive  Affect:  Appropriate  Cognitive:  Alert and Appropriate  Insight:  Appropriate  Engagement in Group:  Engaged  Modes of Intervention:  Discussion and Education  Summary of Progress/Problems: Group played a non competitive, Retail banker game that fosters listening skills as well as self expression.  Waymond Cera 01/15/2019, 3:25 PM

## 2019-01-15 NOTE — Progress Notes (Signed)
D: Patient observed in dayroom this evening. Patient states, "well, I'm not doing all that good. I'm hurting from my sciatica. It's on my L side and just goes down my leg. I've been taking tylenol which has helped but now today it's been pretty rough." Patient reports however that her mood and outlook have improved. "I feel confident in my plan. I've been sober before so I know I can do it. I've always done it alone which clearly isn't working. This time I plan to seek support in the way of AA, 1:1 therapy and [facility]. I finally feel some optimism."  Patient's affect flat, depressed with congruent mood. She brightens slightly with interaction. Pain rated at a 9/10, no other physical complaints or withdrawal reported. COVID-19 screen negative, afebrile. Respiratory assessment WDL.  A: Medicated per orders, prn trazadone and tylenol given per her request. Medication education provided. Level III obs in place for safety. Emotional support offered. Patient encouraged to complete Suicide Safety Plan before discharge. Encouraged to attend and participate in unit programming.  R: Patient verbalizes understanding of POC. Will reassess prns in one hour. Patient denies SI/HI/AVH and remains safe on level III obs. Will continue to monitor throughout the night.

## 2019-01-16 MED ORDER — LORAZEPAM 2 MG/ML IJ SOLN: 1.0000 mg | Freq: Once | INTRAMUSCULAR | Status: AC | PRN

## 2019-01-16 MED ORDER — LORAZEPAM 1 MG PO TABS
1.0000 mg | ORAL_TABLET | Freq: Once | ORAL | Status: AC | PRN
  Administered 2019-01-16: 1 mg via ORAL
  Filled 2019-01-16: qty 1

## 2019-01-16 NOTE — Progress Notes (Addendum)
[facility] MD Progress Note  01/16/2019 11:37 AM Stacey Hunter  MRN:  [id number]   Evaluation:  Stacey Hunter was evaluated earlier this morning.  She was awake, alert and oriented x3.  She presented calm, pleasant and cooperative.  Reported attending a participating with daily group sessions.  Denying any suicidal or homicidal ideations.  Denies auditory or visual hallucinations.  Reported her mood had improved since starting medications.  Reports she is going to continue to focus on herself. discussed discharge consideration for 01/17/2019 appeared receptive to the plan.  Support encouragement and reassurance was provided  During social work group session patient became irate related to discharge plans of peers.  Patient became belligerent, irritable, agitated and disruptive throughout the milieu.  Reported " why did you omit information from me?" I want to leave today to see my daughter.  Patient became confrontational and walked off.  Will provide agitation protocol.  Case staffed with attending psychiatrist.  Patient to be considered for discharge disposition on 01/17/2019.   Principal Problem: Severe recurrent major depression without psychotic features (HCC) Diagnosis: Principal Problem:   Severe recurrent major depression without psychotic features (HCC)  Total Time spent with patient: 15 minutes  Past Psychiatric History: See admission H&P  Past Medical History: History reviewed. No pertinent past medical history. History reviewed. No pertinent surgical history. Family History: History reviewed. No pertinent family history. Family Psychiatric  History: See admission H&P Social History:  Social History   Substance and Sexual Activity  Alcohol Use Yes   Comment: daily      Social History   Substance and Sexual Activity  Drug Use Yes  . Types: Marijuana    Social History   Socioeconomic History  . Marital status: Single    Spouse name: Not on file  . Number of children: Not on  file  . Years of education: Not on file  . Highest education level: Not on file  Occupational History  . Not on file  Social Needs  . Financial resource strain: Not on file  . Food insecurity    Worry: Not on file    Inability: Not on file  . Transportation needs    Medical: Not on file    Non-medical: Not on file  Tobacco Use  . Smoking status: Current Every Day Smoker    Packs/day: 0.00  . Smokeless tobacco: Never Used  Substance and Sexual Activity  . Alcohol use: Yes    Comment: daily   . Drug use: Yes    Types: Marijuana  . Sexual activity: Not on file  Lifestyle  . Physical activity    Days per week: Not on file    Minutes per session: Not on file  . Stress: Not on file  Relationships  . Social Musicianconnections    Talks on phone: Not on file    Gets together: Not on file    Attends religious service: Not on file    Active member of club or organization: Not on file    Attends meetings of clubs or organizations: Not on file    Relationship status: Not on file  Other Topics Concern  . Not on file  Social History Narrative  . Not on file   Additional Social History:                         Sleep: Good  Appetite:  Fair  Current Medications: Current Facility-Administered Medications  Medication Dose Route Frequency Provider  Last Rate Last Dose  . acetaminophen (TYLENOL) tablet 650 mg  650 mg Oral Q6H PRN Rozetta Nunnery, NP   650 mg at 01/15/19 2116  . alum & mag hydroxide-simeth (MAALOX/MYLANTA) 200-200-20 MG/5ML suspension 30 mL  30 mL Oral Q4H PRN Lindon Romp A, NP      . cloNIDine (CATAPRES) tablet 0.1 mg  0.1 mg Oral TID PRN Sharma Covert, MD      . folic acid (FOLVITE) tablet 1 mg  1 mg Oral Daily Sharma Covert, MD   1 mg at 01/16/19 0857  . hydrOXYzine (ATARAX/VISTARIL) tablet 25 mg  25 mg Oral TID PRN Rozetta Nunnery, NP   25 mg at 01/15/19 2116  . LORazepam (ATIVAN) tablet 1 mg  1 mg Oral Once PRN Derrill Center, NP       Or  .  LORazepam (ATIVAN) injection 1 mg  1 mg Intramuscular Once PRN Derrill Center, NP      . magnesium hydroxide (MILK OF MAGNESIA) suspension 30 mL  30 mL Oral Daily PRN Lindon Romp A, NP      . metoprolol tartrate (LOPRESSOR) tablet 50 mg  50 mg Oral BID Sharma Covert, MD   50 mg at 01/16/19 0857  . metroNIDAZOLE (METROGEL) 0.75 % gel   Topical BID Sharma Covert, MD      . multivitamin with minerals tablet 1 tablet  1 tablet Oral Daily Lindon Romp A, NP   1 tablet at 01/16/19 0857  . nicotine polacrilex (NICORETTE) gum 2 mg  2 mg Oral PRN Sharma Covert, MD   2 mg at 01/13/19 1528  . sertraline (ZOLOFT) tablet 50 mg  50 mg Oral Daily Derrill Center, NP   50 mg at 01/16/19 0857  . thiamine (VITAMIN B-1) tablet 100 mg  100 mg Oral Daily Lindon Romp A, NP   100 mg at 01/16/19 0857  . traZODone (DESYREL) tablet 50 mg  50 mg Oral QHS PRN Rozetta Nunnery, NP   50 mg at 01/15/19 2116    Lab Results:  No results found for this or any previous visit (from the past 48 hour(s)).  Blood Alcohol level:  Lab Results  Component Value Date   ETH <10 01/12/2019   ETH 452 (HH) 26/37/8588    Metabolic Disorder Labs: Lab Results  Component Value Date   HGBA1C 5.0 01/13/2019   MPG 96.8 01/13/2019   No results found for: PROLACTIN Lab Results  Component Value Date   CHOL 229 (H) 01/13/2019   TRIG 85 01/13/2019   HDL 98 01/13/2019   CHOLHDL 2.3 01/13/2019   VLDL 17 01/13/2019   LDLCALC 114 (H) 01/13/2019    Physical Findings: AIMS:  , ,  ,  ,    CIWA:  CIWA-Ar Total: 2 COWS:     Musculoskeletal: Strength & Muscle Tone: within normal limits Gait & Station: normal Patient leans: N/A  Psychiatric Specialty Exam: Physical Exam  Nursing note and vitals reviewed. Constitutional: She is oriented to person, place, and time. She appears well-developed and well-nourished.  HENT:  Head: Normocephalic and atraumatic.  Respiratory: Effort normal.  Neurological: She is alert and  oriented to person, place, and time.    ROS  Blood pressure (!) 146/107, pulse (!) 102, temperature 98 F (36.7 C), temperature source Oral, resp. rate 20, SpO2 100 %.There is no height or weight on file to calculate BMI.  General Appearance: Casual  Eye Contact:  Fair  Speech:  Normal Rate  Volume:  Normal  Mood:  Anxious  Affect:  Congruent  Thought Process:  Coherent and Descriptions of Associations: Intact  Orientation:  Full (Time, Place, and Person)  Thought Content:  Logical  Suicidal Thoughts:  No  Homicidal Thoughts:  No  Memory:  Immediate;   Fair Recent;   Fair Remote;   Fair  Judgement:  Intact  Insight:  Fair  Psychomotor Activity:  Normal  Concentration:  Concentration: Fair and Attention Span: Fair  Recall:  [employer]Fair  Fund of Knowledge:  Fair  Language:  Good  Akathisia:  Negative  Handed:  Right  AIMS (if indicated):     Assets:  Desire for Improvement Resilience  ADL's:  Intact  Cognition:  WNL  Sleep:  Number of Hours: 6.5     Treatment Plan Summary: Daily contact with patient to assess and evaluate symptoms and progress in treatment and Medication management   Continue her current treatment plan on 01/16/2019 as listed below except were noted  Major depressive disorder:   Continue hydroxyzine 25 mg p.o. 3 times daily as needed anxiety.  Continue lorazepam 1 mg p.o. every 6 hours PRN a CIWA greater than 10. Continue metoprolol tartrate to 50 mg p.o. twice daily for hypertension and tachycardia. Continue MetroGel topically twice daily for rosacea. Continue continue multivitamin 1 tablet p.o. daily for nutritional supplementation.  Continue Zoloft  50mg  p.o. daily for anxiety and depression. Continue thiamine 100 mg p.o. daily for nutritional supplementation. Continue trazodone 50 mg p.o. nightly as needed insomnia.  Continue clonidine 0.1 mg p.o. 3 times daily as needed systolic blood pressure greater than 160. Continue folic acid 1 mg p.o. daily for  nutritional supplementation.  CSW to continue working on discharge disposition Patient encouraged to participate within the therapeutic milieu  Oneta Rackanika N Lewis, NP 01/16/2019, 11:37 AM

## 2019-01-16 NOTE — Progress Notes (Signed)
D: Pt was in dayroom upon initial approach.  Pt presents with anxious affect and mood.  She reports she is "ill.  I was supposed to leave today."  Pt reports she feels safe to discharge.  Pt denies SI/HI, denies hallucinations, reports left leg pain of 6/10.  Pt has been visible in milieu interacting with peers and staff appropriately.  Pt seen smiling and laughing at times tonight.    A: Introduced self to pt.  Met with pt 1:1.  Actively listened to pt and offered support and encouragement.  PRN medication administered for sleep, anxiety, pain, and SBP over 160 mm/Hg.  15 minute safety checks in place.  R: Pt is safe on the unit.  Pt is compliant with medications.  Pt verbally contracts for safety.  Will continue to monitor and assess.

## 2019-01-16 NOTE — [facility] Group Notes (Signed)
[facility] Group Notes: (Clinical Social Work)   01/16/2019      Type of Therapy:  Group Therapy   Participation Level:  Did Not Attend - was invited both individually by [facility] and by overhead announcement, chose not to attend.  She was standing right outside the group home, was invited by several people, and was angry, red in the face, demanding to speak with NP.  CSW went and told NP about her desire.   Selmer Dominion, LCSW 01/16/2019, 2:10 PM

## 2019-01-16 NOTE — Progress Notes (Signed)
[place] NOVEL CORONAVIRUS (COVID-19) DAILY CHECK-OFF SYMPTOMS - answer yes or no to each - every day NO YES  Have you had a fever in the past 24 hours?  . Fever (Temp > 37.80C / 100F) X   Have you had any of these symptoms in the past 24 hours? . New Cough .  Sore Throat  .  Shortness of Breath .  Difficulty Breathing .  Unexplained Body Aches   X   Have you had any one of these symptoms in the past 24 hours not related to allergies?   . Runny Nose .  Nasal Congestion .  Sneezing   X   If you have had runny nose, nasal congestion, sneezing in the past 24 hours, has it worsened?  X   EXPOSURES - check yes or no X   Have you traveled outside the state in the past 14 days?  X   Have you been in contact with someone with a confirmed diagnosis of COVID-19 or PUI in the past 14 days without wearing appropriate PPE?  X   Have you been living in the same home as a person with confirmed diagnosis of COVID-19 or a PUI (household contact)?    X   Have you been diagnosed with COVID-19?    X              What to do next: Answered NO to all: Answered YES to anything:   Proceed with unit schedule Follow the [facility] Inpatient Flowsheet.   

## 2019-01-16 NOTE — Progress Notes (Signed)
[facility] Group Notes:  (Nursing/MHT/Case Management/Adjunct)  Date:  01/16/2019  Time:  2030  Type of Therapy:  wrap up group  Participation Level:  Active  Participation Quality:  Appropriate, Attentive, Sharing and Supportive  Affect:  Appropriate  Cognitive:  Appropriate  Insight:  Improving  Engagement in Group:  Engaged  Modes of Intervention:  Clarification, Education and Support  Summary of Progress/Problems: Pt enjoyed playing cards with her peer patients today. Pt reported learning a new game. Pt plans on quitting cigarettes when she gets home after smoking for 23 years. Pt is grateful for her 38 year old daughter.   Shellia Cleverly 01/16/2019, 9:52 PM

## 2019-01-17 DIAGNOSIS — F332 Major depressive disorder, recurrent severe without psychotic features: Principal | ICD-10-CM

## 2019-01-17 MED ORDER — SERTRALINE HCL 50 MG PO TABS: 50.0000 mg | ORAL_TABLET | Freq: Every day | ORAL | 0 refills | Status: DC

## 2019-01-17 MED ORDER — METOPROLOL TARTRATE 100 MG PO TABS
100.0000 mg | ORAL_TABLET | Freq: Two times a day (BID) | ORAL | Status: DC
  Filled 2019-01-17 (×4): qty 1

## 2019-01-17 MED ORDER — METOPROLOL TARTRATE 100 MG PO TABS: 100.0000 mg | ORAL_TABLET | Freq: Two times a day (BID) | ORAL | 0 refills | Status: AC

## 2019-01-17 MED ORDER — HYDROXYZINE HCL 25 MG PO TABS: 25.0000 mg | ORAL_TABLET | Freq: Three times a day (TID) | ORAL | 0 refills | Status: AC | PRN

## 2019-01-17 MED ORDER — METRONIDAZOLE 0.75 % EX GEL: Freq: Two times a day (BID) | CUTANEOUS | 0 refills | Status: DC

## 2019-01-17 MED ORDER — TRAZODONE HCL 50 MG PO TABS: 50.0000 mg | ORAL_TABLET | Freq: Every evening | ORAL | 0 refills | Status: DC | PRN

## 2019-01-17 MED ORDER — ADULT MULTIVITAMIN W/MINERALS CH: 1.0000 | ORAL_TABLET | Freq: Every day | ORAL | Status: AC

## 2019-01-17 MED ORDER — NICOTINE POLACRILEX 2 MG MT GUM: 2.0000 mg | CHEWING_GUM | OROMUCOSAL | 0 refills | Status: AC | PRN

## 2019-01-17 NOTE — Discharge Summary (Signed)
Physician Discharge Summary Note  Patient:  Stacey Hunter is an 38 y.o., female MRN:  [id number] DOB:  1980-11-11 Patient phone:  [phone] (home)  Patient address:   [address] [place] [address],  Total Time spent with patient: 15 minutes  Date of Admission:  01/13/2019 Date of Discharge: 01/17/19  Reason for Admission: Suicidal ideation with alcohol abuse  Principal Problem: Severe recurrent major depression without psychotic features [facility](HCC) Discharge Diagnoses: Principal Problem:   Severe recurrent major depression without psychotic features [facility](HCC)   Past Psychiatric History: Patient denied any previous formal psychiatric treatment, formal psychiatric evaluations, and formal psychiatric treatment.  Past Medical History: History reviewed. No pertinent past medical history. History reviewed. No pertinent surgical history. Family History: History reviewed. No pertinent family history. Family Psychiatric  History: Denies Social History:  Social History   Substance and Sexual Activity  Alcohol Use Yes   Comment: daily      Social History   Substance and Sexual Activity  Drug Use Yes  . Types: Marijuana    Social History   Socioeconomic History  . Marital status: Single    Spouse name: Not on file  . Number of children: Not on file  . Years of education: Not on file  . Highest education level: Not on file  Occupational History  . Not on file  Social Needs  . Financial resource strain: Not on file  . Food insecurity    Worry: Not on file    Inability: Not on file  . Transportation needs    Medical: Not on file    Non-medical: Not on file  Tobacco Use  . Smoking status: Current Every Day Smoker    Packs/day: 0.00  . Smokeless tobacco: Never Used  Substance and Sexual Activity  . Alcohol use: Yes    Comment: daily   . Drug use: Yes    Types: Marijuana  . Sexual activity: Not on file  Lifestyle  . Physical activity    Days per week: Not on file     Minutes per session: Not on file  . Stress: Not on file  Relationships  . Social Musicianconnections    Talks on phone: Not on file    Gets together: Not on file    Attends religious service: Not on file    Active member of club or organization: Not on file    Attends meetings of clubs or organizations: Not on file    Relationship status: Not on file  Other Topics Concern  . Not on file  Social History Narrative  . Not on file    Hospital Course:  From admission H&P: Patient is a 38 year old female with a past psychiatric history significant for alcohol dependence, substance-induced mood disorder and alcohol withdrawal who presented to the [facility] emergency department on 01/12/2019 with suicidal ideation. The patient stated that she has been on a "a drinking binge". This has been going on for approximately 3 weeks. The patient stated she was drinking 6-24 ounce beers a day. She had reportedly been sober for at least 3 months prior to this. She was unable to cite any additional psychosocial stressors that led to her relapse. Patient has had alcohol issues since age 38. The only time that she had sought assistance for this was in the past in either 2015 or 16 when she underwent detox. She stated she has episodes where she is able to stay sober, but then relapses and goes on binges.  The patient stated that recently she obtained a bottle of Xanax from an outside source and had plan to overdose on those. She stated she felt so guilty about drinking in front of her daughter that it led her to want to die. Then she realized when she was about to take the overdose of what she would do to her family, and she decided against it and sought help. She denied any other illicit drugs outside of marijuana. She was admitted to the hospital for evaluation and stabilization.  Ms. Grillot was admitted for suicidal ideation with alcohol abuse. CIWA protocol was started with Ativan PRN  CIWA>10. Zoloft, Vistaril and trazodone were started. She participated in group therapy on the unit. She remained on the [facility] unit for 4 days. She stabilized with medication and therapy. She was discharged on the medications listed below. She has shown improvement with improved mood, affect, sleep, appetite, and interaction. She denies any SI/HI/AVH and contracts for safety. She agrees to follow up at [facility] (see below). Patient is provided with prescriptions for medications upon discharge. She is discharging home.  Physical Findings: AIMS:  , ,  ,  ,    CIWA:  CIWA-Ar Total: 4 COWS:     Musculoskeletal: Strength & Muscle Tone: within normal limits Gait & Station: normal Patient leans: N/A  Psychiatric Specialty Exam: Physical Exam  Nursing note and vitals reviewed. Constitutional: She is oriented to person, place, and time. She appears well-developed and well-nourished.  Cardiovascular: Normal rate.  Respiratory: Effort normal.  Neurological: She is alert and oriented to person, place, and time.    Review of Systems  Constitutional: Negative.   Psychiatric/Behavioral: Positive for depression (stable on medication) and substance abuse. Negative for hallucinations and suicidal ideas. The patient is not nervous/anxious and does not have insomnia.     Blood pressure (!) 154/116, pulse 96, temperature 97.8 F (36.6 C), temperature source Oral, resp. rate 20, SpO2 100 %.There is no height or weight on file to calculate BMI.  See MD's discharge SRA    Have you used any form of tobacco in the last 30 days? (Cigarettes, Smokeless Tobacco, Cigars, and/or Pipes): Yes  Has this patient used any form of tobacco in the last 30 days? (Cigarettes, Smokeless Tobacco, Cigars, and/or Pipes) Yes, a prescription for an FDA-approved medication for tobacco cessation was offered at discharge.   Blood Alcohol level:  Lab Results  Component Value Date   ETH <10 01/12/2019   ETH 452 (HH) 48/54/6270     Metabolic Disorder Labs:  Lab Results  Component Value Date   HGBA1C 5.0 01/13/2019   MPG 96.8 01/13/2019   No results found for: PROLACTIN Lab Results  Component Value Date   CHOL 229 (H) 01/13/2019   TRIG 85 01/13/2019   HDL 98 01/13/2019   CHOLHDL 2.3 01/13/2019   VLDL 17 01/13/2019   LDLCALC 114 (H) 01/13/2019    See Psychiatric Specialty Exam and Suicide Risk Assessment completed by Attending Physician prior to discharge.  Discharge destination:  Home  Is patient on multiple antipsychotic therapies at discharge:  No   Has Patient had three or more failed trials of antipsychotic monotherapy by history:  No  Recommended Plan for Multiple Antipsychotic Therapies: NA  Discharge Instructions    Discharge instructions   Complete by: As directed    Patient is instructed to take all prescribed medications as recommended. Report any side effects or adverse reactions to your outpatient psychiatrist. Patient is instructed to abstain from  alcohol and illegal drugs while on prescription medications. In the event of worsening symptoms, patient is instructed to call the crisis hotline, 911, or go to the nearest emergency department for evaluation and treatment.     Allergies as of 01/17/2019   No Known Allergies     Medication List    STOP taking these medications   chlordiazePOXIDE 25 MG capsule Commonly known as: LIBRIUM   DULoxetine 60 MG capsule Commonly known as: CYMBALTA   furosemide 20 MG tablet Commonly known as: LASIX   gabapentin 100 MG capsule Commonly known as: NEURONTIN     TAKE these medications     Indication  hydrOXYzine 25 MG tablet Commonly known as: ATARAX/VISTARIL Take 1 tablet (25 mg total) by mouth 3 (three) times daily as needed for anxiety.  Indication: Feeling Anxious   metoprolol tartrate 100 MG tablet Commonly known as: LOPRESSOR Take 1 tablet (100 mg total) by mouth 2 (two) times daily.  Indication: High Blood Pressure  Disorder   metroNIDAZOLE 0.75 % gel Commonly known as: METROGEL Apply topically 2 (two) times daily.  Indication: Rosacea   multivitamin with minerals Tabs tablet Take 1 tablet by mouth daily. Start taking on: January 18, 2019  Indication: Supplementation   nicotine polacrilex 2 MG gum Commonly known as: NICORETTE Take 1 each (2 mg total) by mouth as needed for smoking cessation.  Indication: Nicotine Addiction   sertraline 50 MG tablet Commonly known as: ZOLOFT Take 1 tablet (50 mg total) by mouth daily. Start taking on: January 18, 2019  Indication: Major Depressive Disorder   traZODone 50 MG tablet Commonly known as: DESYREL Take 1 tablet (50 mg total) by mouth at bedtime as needed for sleep.  Indication: Trouble Sleeping      Follow-up [facility], [facility] Follow up on 01/18/2019.   Why: Hospital discharge appointment with Corrie DandyMary is Tuesday, 6/23 at 9:30a.  Please bring your photo ID, insurance card, SSN, proof od income, current medications, and discharge paperwork from this hospitalization.  Contact information: [address] [place] [address] [phone]           Follow-up recommendations: Activity as tolerated. Diet as recommended by primary care physician. Keep all scheduled follow-up appointments as recommended.   Comments:   Patient is instructed to take all prescribed medications as recommended. Report any side effects or adverse reactions to your outpatient psychiatrist. Patient is instructed to abstain from alcohol and illegal drugs while on prescription medications. In the event of worsening symptoms, patient is instructed to call the crisis hotline, 911, or go to the nearest emergency department for evaluation and treatment.  Signed: Aldean BakerJanet E Spike Desilets, NP 01/17/2019, 9:19 AM

## 2019-01-17 NOTE — Progress Notes (Signed)
Pt received both written and verbal discharge instructions from Jerrye Bushy., RN. Pt verbalized understanding of discharge instructions. Pt agreed to f/u appt and med regimen. Pt received AVS, SRA and a transitional record. Pt gathered belongings from room and locker. Pt safely discharged to the lobby by Sharl Ma, RN.

## 2019-01-17 NOTE — [facility] Suicide Risk Assessment (Signed)
[facility] Discharge Suicide Risk Assessment   Principal Problem: Severe recurrent major depression without psychotic features [facility]) Discharge Diagnoses: Principal Problem:   Severe recurrent major depression without psychotic features ([place])   Total Time spent with patient: 15 minutes  Musculoskeletal: Strength & Muscle Tone: within normal limits Gait & Station: normal Patient leans: N/A  Psychiatric Specialty Exam: Review of Systems  All other systems reviewed and are negative.   Blood pressure (!) 154/116, pulse 96, temperature 97.8 F (36.6 C), temperature source Oral, resp. rate 20, SpO2 100 %.There is no height or weight on file to calculate BMI.  General Appearance: Casual  Eye Contact::  Fair  Speech:  Normal Rate409  Volume:  Normal  Mood:  Anxious  Affect:  Congruent  Thought Process:  Coherent and Descriptions of Associations: Intact  Orientation:  Full (Time, Place, and Person)  Thought Content:  Logical  Suicidal Thoughts:  No  Homicidal Thoughts:  No  Memory:  Immediate;   Fair Recent;   Fair Remote;   Fair  Judgement:  Intact  Insight:  Fair  Psychomotor Activity:  Normal  Concentration:  Good  Recall:  Good  Fund of Knowledge:Good  Language: Good  Akathisia:  Negative  Handed:  Right  AIMS (if indicated):     Assets:  Communication Skills Desire for Improvement Housing Resilience Social Support  Sleep:  Number of Hours: 6  Cognition: WNL  ADL's:  Intact   Mental Status Per Nursing Assessment::   On Admission:  Self-harm thoughts  Demographic Factors:  Caucasian, Low socioeconomic status and Unemployed  Loss Factors: NA  Historical Factors: Impulsivity  Risk Reduction Factors:   Responsible for children under 68 years of age, Sense of responsibility to family, Living with another person, especially a relative and Positive social support  Continued Clinical Symptoms:  Depression:   Comorbid alcohol  abuse/dependence Impulsivity Alcohol/Substance Abuse/Dependencies  Cognitive Features That Contribute To Risk:  None    Suicide Risk:  Minimal: No identifiable suicidal ideation.  Patients presenting with no risk factors but with morbid ruminations; may be classified as minimal risk based on the severity of the depressive symptoms  Follow-up [place], [facility] Follow up on 01/18/2019.   Why: Hospital discharge appointment with Stanton Kidney is Tuesday, 6/23 at 9:30a.  Please bring your photo ID, insurance card, SSN, proof od income, current medications, and discharge paperwork from this hospitalization.  Contact information: [place] [address] [phone]           Plan Of Care/Follow-up recommendations:  Activity:  ad lib  Sharma Covert, MD 01/17/2019, 8:00 AM

## 2019-01-17 NOTE — Progress Notes (Signed)
D:  Patient denied SI and HI, contracts for safety.  Denied A/V hallucinations.   A:  Medications administered per MD orders.  Emotional support and encouragement given patient. R:  Safety maintained with 15 minute checks.  

## 2019-01-17 NOTE — Progress Notes (Signed)
  [facility] Adult Case Management Discharge Plan :  Will you be returning to the same living situation after discharge:  Yes,  home At discharge, do you have transportation home?: Yes,  girlfriend picking up Do you have the ability to pay for your medications: Yes,  Medicaid  Release of information consent forms completed and in the chart. Patient to Follow up at: Follow-up [place], [facility] Follow up on 01/18/2019.   Why: Hospital discharge appointment with Stanton Kidney is Tuesday, 6/23 at 9:30a.  Please bring your photo ID, insurance card, SSN, proof od income, current medications, and discharge paperwork from this hospitalization.  Contact information: [place] [address] [phone]           Next level of care provider has access to [place] and Suicide Prevention discussed: Yes,  with patient. Declined consents  Have you used any form of tobacco in the last 30 days? (Cigarettes, Smokeless Tobacco, Cigars, and/or Pipes): Yes  Has patient been referred to the Quitline?: Yes, faxed on 06/22  Patient has been referred for addiction treatment: Yes  Joellen Jersey, [place] 01/17/2019, 9:47 AM

## 2019-01-17 NOTE — Plan of Care (Signed)
Nurse discussed anxiety, depression, coping skills with patient. 

## 2022-05-30 ENCOUNTER — Ambulatory Visit (INDEPENDENT_AMBULATORY_CARE_PROVIDER_SITE_OTHER): Payer: Medicaid Other | Admitting: Pulmonary Disease

## 2022-05-30 ENCOUNTER — Encounter: Payer: Self-pay | Admitting: Pulmonary Disease

## 2022-05-30 ENCOUNTER — Ambulatory Visit (INDEPENDENT_AMBULATORY_CARE_PROVIDER_SITE_OTHER): Payer: Medicaid Other

## 2022-05-30 VITALS — BP 120/70 | HR 100 | Temp 98.0°F | Ht 64.0 in | Wt 369.0 lb

## 2022-05-30 DIAGNOSIS — R0609 Other forms of dyspnea: Secondary | ICD-10-CM | POA: Diagnosis not present

## 2022-05-30 MED ORDER — BREZTRI AEROSPHERE 160-9-4.8 MCG/ACT IN AERO: 2.0000 | INHALATION_SPRAY | Freq: Two times a day (BID) | RESPIRATORY_TRACT | 3 refills | Status: DC

## 2022-05-30 MED ORDER — BREZTRI AEROSPHERE 160-9-4.8 MCG/ACT IN AERO: 2.0000 | INHALATION_SPRAY | Freq: Two times a day (BID) | RESPIRATORY_TRACT | 0 refills | Status: DC

## 2022-05-30 MED ORDER — ALBUTEROL SULFATE 0.63 MG/3ML IN NEBU: 1.0000 | INHALATION_SOLUTION | RESPIRATORY_TRACT | 3 refills | Status: AC | PRN

## 2022-05-30 NOTE — Patient Instructions (Signed)
Schedule for PFT at next visit  Schedule for echocardiogram  Chest x-ray today  Continue working on quitting smoking  Regular exercises as tolerated  Prescription for Judithann Sauger was sent pharmacy  Prescription for nebulizer machine  Albuterol  Follow-up in 6 to 8 weeks  Call with significant concerns

## 2022-05-30 NOTE — Progress Notes (Signed)
Stacey Hunter    [id number]    1981/04/29  Primary Care Physician:Lee, Joylene Igo, MD  Referring Physician: Cher Nakai, MD [address] [place],  [place] [address]  Chief complaint:   Patient being evaluated for shortness of breath  HPI:  Shortness of breath worse in the last couple years  Active smoker trying to quit On Chantix, down from about 30 sticks a day to about half a pack  Working on trying to quit by the year and  Has smoked for over 20 years  She does use albuterol via nebulizer and MDI as needed at least uses it daily  She is short of breath with activities of daily living Less than 50 feet of walking will make her short of breath  She has a daily cough with sputum production She does have some chest pain/discomfort occasionally  She gets short of breath with most activities  History of hypertension 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   albuterol (VENTOLIN HFA) 108 (90 Base) MCG/ACT inhaler Inhale 1-2 puffs into the lungs every 6 (six) hours as needed for wheezing or shortness of breath.   hydrOXYzine (ATARAX/VISTARIL) 25 MG tablet Take 1 tablet (25 mg total) by mouth 3 (three) times daily as needed for anxiety.   lisinopril-hydrochlorothiazide (ZESTORETIC) 20-12.5 MG tablet Take 1 tablet by mouth daily.   metoprolol tartrate (LOPRESSOR) 100 MG tablet Take 1 tablet (100 mg total) by mouth 2 (two) times daily.   Multiple Vitamin (MULTIVITAMIN WITH MINERALS) TABS tablet Take 1 tablet by mouth daily.   nicotine polacrilex (NICORETTE) 2 MG gum Take 1 each (2 mg total) by mouth as needed for smoking cessation.   VENTOLIN HFA 108 (90 Base) MCG/ACT inhaler Inhale into the lungs.    [DISCONTINUED] metroNIDAZOLE (METROGEL) 0.75 % gel Apply topically 2 (two) times daily.   [DISCONTINUED] sertraline (ZOLOFT) 50 MG tablet Take 1 tablet (50 mg total) by mouth daily.   [DISCONTINUED] traZODone (DESYREL) 50 MG tablet Take 1 tablet (50 mg total) by mouth at bedtime as needed for sleep.   No facility-administered encounter medications on file as of 05/30/2022.    Allergies as of 05/30/2022   (No Known Allergies)    No past medical history on file.  No past surgical history on file.  No family history on file.  Social History   Socioeconomic History   Marital status: Single    Spouse name: Not on file   Number of children: Not on file   Years of education: Not on file   Highest education level: Not on file  Occupational History   Not on file  Tobacco Use   Smoking status: Every Day    Packs/day: 0.00    Types: Cigarettes   Smokeless tobacco: Never   Tobacco comments:    10/12 cigs 05/30/22  Substance and Sexual Activity   Alcohol use: Yes    Comment: daily    Drug use: Yes    Types: Marijuana   Sexual activity: Not on file  Other Topics Concern   Not on file  Social History Narrative   Not on file   Social Determinants of Health   Financial Resource Strain: Not on file  Food Insecurity:  Not on file  Transportation Needs: Not on file  Physical Activity: Not on file  Stress: Not on file  Social Connections: Not on file  Intimate Partner Violence: Not on file    Review of Systems  Constitutional:  Negative for fatigue.  Respiratory:  Positive for chest tightness and shortness of breath.     Vitals:   05/30/22 1058  BP: 120/70  Pulse: 100  SpO2: 94%     Physical Exam Constitutional:      Appearance: She is obese.  HENT:     Head: Normocephalic.     Mouth/Throat:     Mouth: Mucous membranes are moist.  Eyes:     Pupils: Pupils are equal, round, and reactive to light.  Cardiovascular:     Rate and Rhythm: Normal rate and regular  rhythm.     Heart sounds: No murmur heard.    No friction rub.  Pulmonary:     Effort: No respiratory distress.     Breath sounds: No stridor. No wheezing or rhonchi.  Musculoskeletal:     Cervical back: No rigidity or tenderness.  Neurological:     Mental Status: She is alert.  Psychiatric:        Mood and Affect: Mood normal.      Data Reviewed: Has had no recent chest x-ray  Records for event for pulmonary embolism not available  Assessment:  Shortness of breath with activity  Underlying obstructive lung disease  Active smoker -Trying to quit  Class III obesity  Deconditioning  Chronic bronchitis  Chronic back pain and hip pain-May be contributing to limitations as well  Plan/Recommendations: Encouraged to continue working on quitting smoking  She does have a prescription for Chantix which she has been taking  Refills for albuterol  New nebulizer machine  Samples and prescription for Breztri  The importance of quitting smoking was reiterated  Weight loss efforts encouraged  Graded activities as tolerated  Follow-up in about 6 to 8 weeks We will obtain a pulmonary function test at next visit  Schedule for echocardiogram for shortness of breath on exertion  Chest x-ray today   Virl Diamond MD [facility] 05/30/2022, 11:19 AM  CC: Simone Curia, MD

## 2022-06-16 ENCOUNTER — Ambulatory Visit: Payer: Medicaid Other | Attending: Cardiology

## 2022-06-16 DIAGNOSIS — R0609 Other forms of dyspnea: Secondary | ICD-10-CM | POA: Diagnosis not present

## 2022-06-17 LAB — ECHOCARDIOGRAM COMPLETE
Area-P 1/2: 2.53 cm2
S' Lateral: 3.2 cm

## 2022-07-24 ENCOUNTER — Ambulatory Visit: Payer: Medicaid Other | Admitting: Pulmonary Disease

## 2022-08-27 ENCOUNTER — Ambulatory Visit (INDEPENDENT_AMBULATORY_CARE_PROVIDER_SITE_OTHER): Payer: Medicaid Other | Admitting: Pulmonary Disease

## 2022-08-27 ENCOUNTER — Encounter: Payer: Self-pay | Admitting: Pulmonary Disease

## 2022-08-27 VITALS — BP 134/82 | HR 90 | Ht 65.0 in | Wt 383.0 lb

## 2022-08-27 DIAGNOSIS — R0609 Other forms of dyspnea: Secondary | ICD-10-CM | POA: Diagnosis not present

## 2022-08-27 DIAGNOSIS — R0600 Dyspnea, unspecified: Secondary | ICD-10-CM | POA: Diagnosis not present

## 2022-08-27 DIAGNOSIS — R942 Abnormal results of pulmonary function studies: Secondary | ICD-10-CM

## 2022-08-27 DIAGNOSIS — R0681 Apnea, not elsewhere classified: Secondary | ICD-10-CM

## 2022-08-27 DIAGNOSIS — G4733 Obstructive sleep apnea (adult) (pediatric): Secondary | ICD-10-CM

## 2022-08-27 LAB — PULMONARY FUNCTION TEST
DL/VA % pred: 80 %
DL/VA: 3.52 ml/min/mmHg/L
DLCO cor % pred: 49 %
DLCO cor: 11.21 ml/min/mmHg
DLCO unc % pred: 49 %
DLCO unc: 11.21 ml/min/mmHg
FEF 25-75 Post: 1.05 L/sec
FEF 25-75 Pre: 0.49 L/sec
FEF2575-%Change-Post: 113 %
FEF2575-%Pred-Post: 33 %
FEF2575-%Pred-Pre: 15 %
FEV1-%Change-Post: 33 %
FEV1-%Pred-Post: 39 %
FEV1-%Pred-Pre: 29 %
FEV1-Post: 1.22 L
FEV1-Pre: 0.91 L
FEV1FVC-%Change-Post: 12 %
FEV1FVC-%Pred-Pre: 72 %
FEV6-%Change-Post: 19 %
FEV6-%Pred-Post: 49 %
FEV6-%Pred-Pre: 41 %
FEV6-Post: 1.84 L
FEV6-Pre: 1.54 L
FEV6FVC-%Pred-Post: 101 %
FEV6FVC-%Pred-Pre: 101 %
FVC-%Change-Post: 19 %
FVC-%Pred-Post: 48 %
FVC-%Pred-Pre: 40 %
FVC-Post: 1.84 L
FVC-Pre: 1.54 L
Post FEV1/FVC ratio: 66 %
Post FEV6/FVC ratio: 100 %
Pre FEV1/FVC ratio: 59 %
Pre FEV6/FVC Ratio: 100 %
RV % pred: 342 %
RV: 5.67 L
TLC % pred: 152 %
TLC: 7.93 L

## 2022-08-27 NOTE — Progress Notes (Signed)
Full PFT completed today

## 2022-08-27 NOTE — Progress Notes (Signed)
Stacey Hunter    [id number]    1981/01/11  Primary Care Physician:Lee, Joylene Igo, MD  Referring Physician: Cher Nakai, MD [address] [place],  [place] [address]  Chief complaint:   Patient being evaluated for shortness of breath Snoring  HPI:  Breathing is difficult Continues to smoke a pack a day down from about 3 packs of cigarettes  Uses albuterol as needed  Admits to snoring usually goes to bed about 11 PM, wakes up at 745 wakes up to use the bathroom sometimes   Admits to snoring, witnessed apneas Choking episodes, dryness of the mouth in the morning Denies morning headaches Both parents snored  Memory is good  Continues to smoke actively as smoked about 20 years Uses albuterol as needed  Has smoked for over 20 years  She does use albuterol via nebulizer and MDI as needed at least uses it daily  She is short of breath with activities of daily living Less than 50 feet of walking will make her short of breath  She has a daily cough with sputum production She does have some chest pain/discomfort occasionally  She gets short of breath with most activities  History of hypertension 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   albuterol (VENTOLIN HFA) 108 (90 Base) MCG/ACT inhaler Inhale 1-2 puffs into the lungs every 6 (six) hours as needed for wheezing or shortness of breath.   hydrOXYzine (ATARAX/VISTARIL) 25 MG tablet Take 1 tablet (25 mg total) by mouth 3 (three) times daily as needed for anxiety.   lisinopril-hydrochlorothiazide (ZESTORETIC) 20-12.5 MG tablet Take 1 tablet by mouth daily.   metoprolol tartrate (LOPRESSOR) 100  MG tablet Take 1 tablet (100 mg total) by mouth 2 (two) times daily.   Multiple Vitamin (MULTIVITAMIN WITH MINERALS) TABS tablet Take 1 tablet by mouth daily.   tiZANidine (ZANAFLEX) 4 MG tablet Take 4 mg by mouth 3 (three) times daily.   VENTOLIN HFA 108 (90 Base) MCG/ACT inhaler Inhale into the lungs.   Budeson-Glycopyrrol-Formoterol (BREZTRI AEROSPHERE) 160-9-4.8 MCG/ACT AERO Inhale 2 puffs into the lungs in the morning and at bedtime. (Patient not taking: Reported on 08/27/2022)   Budeson-Glycopyrrol-Formoterol (BREZTRI AEROSPHERE) 160-9-4.8 MCG/ACT AERO Inhale 2 puffs into the lungs in the morning and at bedtime. (Patient not taking: Reported on 08/27/2022)   nicotine polacrilex (NICORETTE) 2 MG gum Take 1 each (2 mg total) by mouth as needed for smoking cessation. (Patient not taking: Reported on 08/27/2022)   No facility-administered encounter medications on file as of 08/27/2022.    Allergies as of 08/27/2022   (No Known Allergies)    No past medical history on file.  No past surgical history on file.  No family history on file.  Social History   Socioeconomic History   Marital status: Single    Spouse name: Not on file   Number of children: Not on file   Years of education: Not on file   Highest education level: Not on file  Occupational History   Not on file  Tobacco Use   Smoking status: Every Day    Packs/day: 0.00    Types: Cigarettes   Smokeless  tobacco: Never   Tobacco comments:    10/12 cigs 05/30/22  Substance and Sexual Activity   Alcohol use: Yes    Comment: daily    Drug use: Yes    Types: Marijuana   Sexual activity: Not on file  Other Topics Concern   Not on file  Social History Narrative   Not on file   Social Determinants of Health   Financial Resource Strain: Not on file  Food Insecurity: Not on file  Transportation Needs: Not on file  Physical Activity: Not on file  Stress: Not on file  Social Connections: Not on file  Intimate Partner  Violence: Not on file    Review of Systems  Constitutional:  Negative for fatigue.  Respiratory:  Positive for chest tightness and shortness of breath.   Psychiatric/Behavioral:  Positive for sleep disturbance.     Vitals:   08/27/22 1148  BP: 134/82  Pulse: 90  SpO2: 93%     Physical Exam Constitutional:      Appearance: She is obese.  HENT:     Head: Normocephalic.     Mouth/Throat:     Mouth: Mucous membranes are moist.  Eyes:     General: No scleral icterus.    Pupils: Pupils are equal, round, and reactive to light.  Cardiovascular:     Rate and Rhythm: Normal rate and regular rhythm.     Heart sounds: No murmur heard.    No friction rub.  Pulmonary:     Effort: No respiratory distress.     Breath sounds: No stridor. No wheezing or rhonchi.  Musculoskeletal:     Cervical back: No rigidity or tenderness.  Neurological:     Mental Status: She is alert.  Psychiatric:        Mood and Affect: Mood normal.      Data Reviewed: Has had no recent chest x-ray  PFT with severe obstructive disease with severe hyperinflation  Assessment:  Shortness of breath with activity  Obstructive lung disease  Moderate to high probability of significant obstructive sleep apnea  Active smoker  Class III obesity  Deconditioning  Plan/Recommendations: Encouraged to continue working on quitting smoking  Schedule patient for an in lab split-night study, risk of obesity hypoventilation  Continue bronchodilators  Graded exercise as tolerated  Weight loss efforts encouraged  Follow-up in 3 months   Sherrilyn Rist MD [facility] 08/27/2022, 11:54 AM  CC: Cher Nakai, MD

## 2022-08-27 NOTE — Patient Instructions (Signed)
Schedule for in lab split-night study  Schedule CT scan of the chest-lung cancer screening  Follow-up in about 3 months  Continue to work on quitting smoking  Call us with significant concerns

## 2022-09-23 ENCOUNTER — Encounter: Payer: Self-pay | Admitting: Pulmonary Disease

## 2022-09-25 ENCOUNTER — Ambulatory Visit
Admission: RE | Admit: 2022-09-25 | Discharge: 2022-09-25 | Disposition: A | Discharge status: Home / Self Care | Payer: Medicaid Other | Source: Ambulatory Visit | Attending: Pulmonary Disease | Admitting: Pulmonary Disease

## 2022-09-25 DIAGNOSIS — R0600 Dyspnea, unspecified: Secondary | ICD-10-CM

## 2022-09-25 DIAGNOSIS — R942 Abnormal results of pulmonary function studies: Secondary | ICD-10-CM

## 2022-10-13 ENCOUNTER — Telehealth: Payer: Self-pay | Admitting: Pulmonary Disease

## 2022-10-13 NOTE — Telephone Encounter (Signed)
Pt calling back for Ct Results from 09/25/2022

## 2022-10-13 NOTE — Telephone Encounter (Signed)
Dr. Ander Slade can you please advise on patients ct results

## 2022-10-15 ENCOUNTER — Telehealth: Payer: Self-pay | Admitting: Pulmonary Disease

## 2022-10-15 NOTE — Telephone Encounter (Signed)
Dr. Ander Slade when you have a minute can you advise on patients CT results

## 2022-10-15 NOTE — Telephone Encounter (Signed)
PT calling again for the CT results. Her # [phone]

## 2022-10-15 NOTE — Telephone Encounter (Signed)
error 

## 2022-10-16 NOTE — Telephone Encounter (Signed)
PT calling for results again. Adv. Dr. Ander Slade was to call and we have sent a message to him.

## 2022-10-17 NOTE — Telephone Encounter (Signed)
Discussed CT scan results with patient. She verbalized understanding. Nothing further needed.

## 2022-10-17 NOTE — Telephone Encounter (Signed)
Did review CT scan  Findings consistent with smoking-related inflammation in the lung -It will progress if patient continues to smoke and lead to scarring that is not reversible -The only treatment is still to avoid exposure to cigarette smoke  The spots in the lung are stable from 2020-not a cancerous process

## 2022-10-17 NOTE — Telephone Encounter (Signed)
Dr. Jenetta Downer  Can you please advise on CT results. Patient has called several times

## 2022-10-17 NOTE — Telephone Encounter (Signed)
Patient checking on CT results,. Patient phone number is [phone].

## 2022-10-17 NOTE — Telephone Encounter (Signed)
ATC X1 LVM for patient to call the office back 

## 2023-01-27 ENCOUNTER — Ambulatory Visit (HOSPITAL_BASED_OUTPATIENT_CLINIC_OR_DEPARTMENT_OTHER): Payer: Medicaid Other | Admitting: Pulmonary Disease

## 2023-02-25 ENCOUNTER — Ambulatory Visit (HOSPITAL_BASED_OUTPATIENT_CLINIC_OR_DEPARTMENT_OTHER): Payer: Medicaid Other | Attending: Pulmonary Disease | Admitting: Pulmonary Disease

## 2023-02-25 VITALS — Ht 63.0 in | Wt >= 6400 oz

## 2023-02-25 DIAGNOSIS — G4730 Sleep apnea, unspecified: Secondary | ICD-10-CM

## 2023-02-25 DIAGNOSIS — G4733 Obstructive sleep apnea (adult) (pediatric): Secondary | ICD-10-CM

## 2023-02-25 DIAGNOSIS — R0681 Apnea, not elsewhere classified: Secondary | ICD-10-CM

## 2023-03-19 ENCOUNTER — Telehealth: Payer: Self-pay | Admitting: Pulmonary Disease

## 2023-03-19 DIAGNOSIS — G4730 Sleep apnea, unspecified: Secondary | ICD-10-CM

## 2023-03-19 NOTE — Procedures (Signed)
POLYSOMNOGRAPHY  Last, First: Stacey, Hunter MRN: [id number] Gender: Female Age (years): 42 Weight (lbs): 410 DOB: 10/21/1980 BMI: 73 Primary Care: No PCP Epworth Score: 10 Referring: Tomma Lightning MD Technician: Shelah Lewandowsky Interpreting: Tomma Lightning MD Study Type: Split Night BiPAP Ordered Study Type: Split Night CPAP Study date: 02/25/2023 Location: [facility] CLINICAL INFORMATION Stacey Hunter is a 42 year old Female and was referred to the sleep center for evaluation of G47.33 OSA: Adult and Pediatric (327.23). Indications include Excessive Daytime Sleepiness, Fatigue, Hypertension, Obesity, Snoring, Witnessed Apneas.  MEDICATIONS Patient self administered medications include: N/A. Medications administered during study include No sleep medicine administered.  SLEEP STUDY TECHNIQUE The patient underwent an attended overnight level one polysomnography titration to assess the effects of BIPAP therapy. The following variables were monitored: EEG (C4-A1, C3-A2, O1-A2, O2-A1), EOG, submental and leg EMG, ECG, oxyhemoglobin saturation by pulse oximetry, thoracic and abdominal respiratory effort belts, nasal/oral airflow by pressure sensor, body position sensor and snoring sensor. BIPAP pressure was titrated to eliminate apneas, hypopneas and oxygen desaturation.Hypopneas were scored per AASM definition IB (4% desaturation)  The NPSG portion of the study ended at 12:32:39 AM. The BiPAP titration was initiated at 12:37:41 AM AM with the BIPAP portion of the study ending at 5:01:17 AM.  TECHNICIAN COMMENTS Comments added by Technician: NONE Comments added by Scorer: N/A SLEEP ARCHITECTURE The recording time for the entire night was 399 minutes.  The diagnostic portion was initiated at 10:22:19 PM and terminated at 12:32:39 AM. The time in bed was 130.3 minutes. EEG confirmed total sleep time was 129.7 minutes yielding a sleep efficiency of 99.5%. Sleep onset after  lights out was 0.7 minutes with a REM latency of N/A minutes. The patient spent 1.2% of the night in stage N1 sleep, 82.3% in stage N2 sleep, 16.6% in stage N3 and 0% in REM. The Arousal Index was 20.8/hour.  The titration portion was initiated at 12:37:41 AM and terminated at 5:01:17 AM. The time in bed was 263.6 minutes. EEG confirmed total sleep time was 255.0 minutes yielding a sleep efficiency of 96.7%. Sleep onset after BiPAP initiation was 5.3 minutes with a REM latency of 55.5 minutes. The patient spent 0.8% of the night in stage N1 sleep, 37.3% in stage N2 sleep, 1.0% in stage N3 and 61% in REM. The Arousal Index was 5.6/hour. RESPIRATORY PARAMETERS During the diagnostic portion, there were a total of 293 respiratory disturbances recorded; 6 apneas ( 6 obstructive, 0 mixed, 0 central), 284 hypopneas and 3 RERAs. The apnea/hypopnea index 134.2 was events/hour and the RDI was 135.6 events/hour. The central sleep apnea index was 0.0 events/hour. The REM AHI was N/A/h and NREM AHI was 134.2/h. The REM RDI was 0.0/h and NREM RDI was 135.6/h. The supine AHI was 134.2/h, and the non supine AHI was 0/h; supine during 100.0% of sleep. The supine RDI was 135.6/h, and the non supine RDI was N/A/h. Respiratory disturbances were associated with oxygen desaturation down to a nadir of 76.0 % during sleep. The mean oxygen saturation during the study was 85.7%. The cumulative time under 88% oxygen saturation was 110.6 minutes.  During the titration portion, the apnea/hypopnea index (AHI) was 30.8 events/hour and the RDI was 32.0 events/hour. The central sleep apnea index was events/hour. The most appropriate setting of BiPAP was IPAP/EPAP 22/18 cm H2O. At this setting, the sleep efficiency was 94% and the patient was supine for 100%. The AHI was 84.1 events per hour(with 2 central events). Oxygen nadir was  80.0 . LEG MOVEMENT DATA The periodic limb movement index was 0.0/hour with an associated arousal index of  /hour. CARDIAC DATA The underlying cardiac rhythm was most consistent with sinus rhythm. Mean heart rate was 69.1 during diagnostic portion and 75.6 during titration portion of study. Additional rhythm abnormalities include None.  IMPRESSIONS - EKG showed no cardiac abnormalities. - The patient snored with moderate snoring volume. - Severe Obstructive Sleep apnea(OSA) Optimal pressure attained. - No Significant Central Sleep Apnea (CSA) - Severe Oxygen Desaturation - No significant periodic leg movements(PLMs) during sleep. However, no significant associated arousals. - Normal sleep efficiency, short primary sleep latency, long REM sleep latency and normal slow wave latency.  DIAGNOSIS - Severe Obstructive Sleep Apnea (G47.33)  RECOMMENDATIONS - Trial of CPAP at 15 cm H2O with a Small size [employer] Full Face Simplus mask and heated humidification, with 2L of oxygen into the system. - Avoid alcohol, sedatives and other CNS depressants that may worsen sleep apnea and disrupt normal sleep architecture. - Sleep hygiene should be reviewed to assess factors that may improve sleep quality. - Weight management and regular exercise should be initiated or continued. - Return to Sleep Center for re-evaluation.  [Electronically signed] 03/19/2023 06:15 AM  Virl Diamond MD NPI: [id number] Will

## 2023-03-19 NOTE — Telephone Encounter (Signed)
Call patient  Sleep study result  Date of study: 02/25/2023  Impression: Severe obstructive sleep apnea  Recommendation:  DME referral  Recommend CPAP therapy for Severe obstructive sleep apnea  Trial of CPAP at 15 cm H2O with a Small size Fisher[employer] Full Face Simplus mask and heated humidification, with 2L of oxygen into the system.  Encourage weight loss measures  Follow-up in the office 4 to 6 weeks following initiation of treatment

## 2023-03-19 NOTE — Telephone Encounter (Signed)
ATC x1 LVM for patient to call  our office back regarding sleep study result's.  

## 2023-05-29 ENCOUNTER — Telehealth: Payer: Medicaid Other | Admitting: Adult Health

## 2023-05-29 ENCOUNTER — Encounter: Payer: Self-pay | Admitting: Adult Health

## 2023-05-29 DIAGNOSIS — J449 Chronic obstructive pulmonary disease, unspecified: Secondary | ICD-10-CM | POA: Diagnosis not present

## 2023-05-29 DIAGNOSIS — G4733 Obstructive sleep apnea (adult) (pediatric): Secondary | ICD-10-CM

## 2023-05-29 DIAGNOSIS — Z720 Tobacco use: Secondary | ICD-10-CM

## 2023-05-29 DIAGNOSIS — J9611 Chronic respiratory failure with hypoxia: Secondary | ICD-10-CM

## 2023-05-29 MED ORDER — BREZTRI AEROSPHERE 160-9-4.8 MCG/ACT IN AERO: 2.0000 | INHALATION_SPRAY | Freq: Two times a day (BID) | RESPIRATORY_TRACT | 5 refills | Status: DC

## 2023-05-29 NOTE — Patient Instructions (Addendum)
Restart Breztri 2 puffs Twice daily, rinse after use.  Albuterol inhaler As needed   Work on quitting smoking   Begin CPAP At bedtime with Oxygen , wear all night long  Work on healthy weight loss  Do not drive if sleepy  Use caution with sedating medications   Follow up with Dr. Wynona Neat in 6-8 weeks and As needed  -please call to book this appointment

## 2023-05-29 NOTE — Progress Notes (Signed)
Virtual Visit via Video Note  I connected with Stacey Hunter on 05/29/23 at  3:00 PM EDT by a video enabled telemedicine application and verified that I am speaking with the correct person using two identifiers.  Location: Patient: Home  Provider: Office    I discussed the limitations of evaluation and management by telemedicine and the availability of in person appointments. The patient expressed understanding and agreed to proceed.  History of Present Illness: Discussed the use of AI scribe software for clinical note transcription with the patient, who gave verbal consent to proceed.  History of Present Illness   42 year old female active smoker seen for pulmonary consult May 30, 2022 found to have severe obstructive sleep apnea and severe COPD   Medical history significant for DVT and PE around 2020 completed anticoagulation course. Last seen by Dr. Wynona Neat in January for suspected sleep apnea, underwent a split night sleep study on February 25, 2023. The study confirmed severe sleep apnea, She was recommended to begin CPAP with O2 . Titration portion of the study showed optimal control on CPAP 15cmH2O with O2 2l/m .  We discussed her results in detail and she will begin CPAP .   In addition to sleep apnea, the patient has severe  COPD.  Despite recommendations to quit, the patient continues to smoke, albeit reduced from three packs to one pack per day. The patient was previously prescribed Ventolin and albuterol for symptom management. A trial of Markus Daft was initiated but not continued. The patient reported ongoing respiratory symptoms with cough and congestion and dyspnea. Previous PFTs showed severe airflow obstruction , +BD change.    Current Outpatient Medications on File Prior to Visit  Medication Sig Dispense Refill   albuterol (ACCUNEB) 0.63 MG/3ML nebulizer solution Take 3 mLs (0.63 mg total) by nebulization every 4 (four) hours as needed for wheezing. 75 mL 3   albuterol  (VENTOLIN HFA) 108 (90 Base) MCG/ACT inhaler Inhale 1-2 puffs into the lungs every 6 (six) hours as needed for wheezing or shortness of breath.     hydrOXYzine (ATARAX/VISTARIL) 25 MG tablet Take 1 tablet (25 mg total) by mouth 3 (three) times daily as needed for anxiety. 30 tablet 0   lisinopril-hydrochlorothiazide (ZESTORETIC) 20-12.5 MG tablet Take 1 tablet by mouth daily.     metoprolol tartrate (LOPRESSOR) 100 MG tablet Take 1 tablet (100 mg total) by mouth 2 (two) times daily. 60 tablet 0   Multiple Vitamin (MULTIVITAMIN WITH MINERALS) TABS tablet Take 1 tablet by mouth daily.     nicotine polacrilex (NICORETTE) 2 MG gum Take 1 each (2 mg total) by mouth as needed for smoking cessation. (Patient not taking: Reported on 08/27/2022) 100 tablet 0   tiZANidine (ZANAFLEX) 4 MG tablet Take 4 mg by mouth 3 (three) times daily.     VENTOLIN HFA 108 (90 Base) MCG/ACT inhaler Inhale into the lungs.     No current facility-administered medications on file prior to visit.         Observations/Objective: Split-night sleep  study:02/25/2023 : Severe OSA  Trial of CPAP at 15 cm H2O with a Small size [employer] Full Face Simplus mask and heated humidification, with 2L of oxygen into the system.   August 27, 2022 PFTs showed severe airflow obstruction with reversibility   Assessment and Plan: Assessment and Plan    Severe Obstructive Sleep Apnea   Confirmed by a sleep study on February 25, 2023, which showed severe OSA requiring CPAP with O2 . She has been  recommended CPAP therapy with O2 2l/m  to maintain airway patency and oxygen levels. She agreed to start CPAP therapy with the mask recommended during the sleep study and supplemental oxygen due to desaturation. We will order CPAP therapy with the mask recommended during the sleep study and supplemental oxygen due to desaturation during the sleep study. A follow-up appointment is scheduled in six weeks to evaluate the efficacy of CPAP and oxygen  therapy.  Chronic Obstructive Pulmonary Disease (COPD)   Severe COPD was confirmed by spirometry in January, with ongoing smoking despite recommendations to quit. She previously used Ventolin and albuterol . We discussed restarting Breztri inhaler is  acceptable. We emphasized smoking cessation due to lung inflammation observed in the February CT scan. She has reduced smoking from three packs a day to one pack a day. We will restart Breztri inhaler, two puffs twice daily, continue albuterol inhaler as needed, and encourage smoking cessation, providing support for quitting.  Abnormal CT chest with RB ILD changes.  Encouraged on smoking cessation.  General Health Maintenance   Smoking cessation is crucial due to lung inflammation observed in the February CT scan. We will encourage smoking cessation and provide resources for support.  Follow-up   A follow-up appointment is scheduled in six weeks. We will perform an in-office walk test to evaluate the need for daytime oxygen. She is instructed to call the office if she does not hear from the home care company about CPAP and oxygen within a week.        Follow Up Instructions:    I discussed the assessment and treatment plan with the patient. The patient was provided an opportunity to ask questions and all were answered. The patient agreed with the plan and demonstrated an understanding of the instructions.   The patient was advised to call back or seek an in-person evaluation if the symptoms worsen or if the condition fails to improve as anticipated.  I provided 35  minutes of non-face-to-face time during this encounter.   Rubye Oaks, NP

## 2023-06-01 ENCOUNTER — Other Ambulatory Visit (HOSPITAL_COMMUNITY): Payer: Self-pay

## 2023-06-01 ENCOUNTER — Telehealth: Payer: Self-pay

## 2023-06-01 NOTE — Telephone Encounter (Signed)
*  Pulm  Prior Authorization form/request asks a question that requires your assistance. Please see the question below and advise accordingly.   Has patient tried and failed any other inhalers for their diagnosis?   CMM Key: [id number]

## 2023-06-11 NOTE — Telephone Encounter (Signed)
Looking through pt chat, I believe she has only been on breztri

## 2023-06-15 NOTE — Telephone Encounter (Signed)
Original request expired, new request created   CMM Key: [id number]

## 2023-06-17 NOTE — Telephone Encounter (Signed)
Pharmacy Patient Advocate Encounter  Received notification from [facility] that Prior Authorization for [employer] Aerosphere 160 9-4.8MCG/ACT has been DENIED.  See denial reason below. No denial letter attached in CMM. Will attach denial letter to Media tab once received.   PA #/Case ID/Reference #: Here are the policy requirements your request did not meet: Per your health plan's criteria, this drug is covered if you meet the following: One of the following: (1) You have failed two preferred drugs as confirmed by claims history or submission of medical records. The preferred drugs: brand Advair Diskus, Dulera and brand Symbicort. (2) You cannot use two preferred drugs (please specify contraindication or intolerance).

## 2023-06-18 MED ORDER — SYMBICORT 160-4.5 MCG/ACT IN AERO: 2.0000 | INHALATION_SPRAY | Freq: Two times a day (BID) | RESPIRATORY_TRACT | 12 refills | Status: AC

## 2023-06-18 NOTE — Addendum Note (Signed)
Addended by: Julio Sicks on: 06/18/2023 05:28 PM   Modules accepted: Orders

## 2023-06-18 NOTE — Telephone Encounter (Signed)
Please let them know insurance will not cover Breztri .   Can change to Symbicort 160 2 puffs Twice daily   Rx sent to pharmacy  Make sure if she has Breztri to finish and then start Symbicort

## 2023-07-06 ENCOUNTER — Ambulatory Visit: Payer: Medicaid Other | Admitting: Adult Health

## 2023-07-06 ENCOUNTER — Encounter: Payer: Self-pay | Admitting: Adult Health

## 2023-07-06 ENCOUNTER — Ambulatory Visit: Payer: Medicaid Other

## 2023-07-06 VITALS — BP 134/88 | HR 96 | Temp 97.9°F | Ht 64.0 in | Wt >= 6400 oz

## 2023-07-06 DIAGNOSIS — G4733 Obstructive sleep apnea (adult) (pediatric): Secondary | ICD-10-CM | POA: Diagnosis not present

## 2023-07-06 DIAGNOSIS — J449 Chronic obstructive pulmonary disease, unspecified: Secondary | ICD-10-CM

## 2023-07-06 DIAGNOSIS — R0602 Shortness of breath: Secondary | ICD-10-CM | POA: Insufficient documentation

## 2023-07-06 DIAGNOSIS — J9611 Chronic respiratory failure with hypoxia: Secondary | ICD-10-CM

## 2023-07-06 DIAGNOSIS — Z720 Tobacco use: Secondary | ICD-10-CM | POA: Diagnosis not present

## 2023-07-06 MED ORDER — SPIRIVA RESPIMAT 2.5 MCG/ACT IN AERS: 2.0000 | INHALATION_SPRAY | Freq: Every day | RESPIRATORY_TRACT | Status: AC

## 2023-07-06 MED ORDER — SPIRIVA RESPIMAT 2.5 MCG/ACT IN AERS: 2.0000 | INHALATION_SPRAY | Freq: Every day | RESPIRATORY_TRACT | 5 refills | Status: AC

## 2023-07-06 NOTE — Patient Instructions (Addendum)
Symbicort  2 puffs Twice daily, rinse after use.  Begin Spiriva 2 puffs daily  Albuterol inhaler As needed   Work on quitting smoking   Labs today   CPAP At bedtime with Oxygen , wear all night long  Work on healthy weight loss  Do not drive if sleepy  Use caution with sedating medications   Follow up with Dr. Wynona Neat in 4 months and As needed   Please contact office for sooner follow up if symptoms do not improve or worsen or seek emergency care

## 2023-07-06 NOTE — Progress Notes (Signed)
Patient seen in the office today and instructed on use of Spiriva.  Patient expressed understanding and demonstrated technique. 

## 2023-07-06 NOTE — Assessment & Plan Note (Signed)
Smoking cessation discussed 

## 2023-07-06 NOTE — Assessment & Plan Note (Signed)
Continue on oxygen 2 L at bedtime with her CPAP.  Walk test today in the office shows no significant desaturations

## 2023-07-06 NOTE — Assessment & Plan Note (Signed)
Severe obstructive sleep apnea with excellent control and compliance on CPAP.  She is continue on CPAP at bedtime with oxygen.

## 2023-07-06 NOTE — Assessment & Plan Note (Signed)
Chronic dyspnea questionable etiology.  Check chest x-ray today. Lab work including D-dimer.  Patient has a history of DVT and PE.  Exam is unrevealing.  If D-dimer is positive we will need to order a CT angio with PE protocol. CBC and BMP are also pending. Will add in Spiriva-for triple therapy for severe COPD. Plan  Patient Instructions  Symbicort  2 puffs Twice daily, rinse after use.  Begin Spiriva 2 puffs daily  Albuterol inhaler As needed   Work on quitting smoking   Labs today   CPAP At bedtime with Oxygen , wear all night long  Work on healthy weight loss  Do not drive if sleepy  Use caution with sedating medications   Follow up with Dr. Wynona Neat in 4 months and As needed   Please contact office for sooner follow up if symptoms do not improve or worsen or seek emergency care

## 2023-07-06 NOTE — Assessment & Plan Note (Signed)
Very severe COPD-patient does have some improvement with Symbicort.  Will add in Spiriva for triple therapy.  She is encouraged on smoking cessation.  Check alpha-1 testing today.  Plan  Patient Instructions  Symbicort  2 puffs Twice daily, rinse after use.  Begin Spiriva 2 puffs daily  Albuterol inhaler As needed   Work on quitting smoking   Labs today   CPAP At bedtime with Oxygen , wear all night long  Work on healthy weight loss  Do not drive if sleepy  Use caution with sedating medications   Follow up with Dr. Wynona Neat in 4 months and As needed   Please contact office for sooner follow up if symptoms do not improve or worsen or seek emergency care

## 2023-07-06 NOTE — Progress Notes (Signed)
@Patient  ID: Verda Cumins, female    DOB: October 15, 1980, 42 y.o.   MRN: [id number]  Chief Complaint  Patient presents with   Follow-up    Referring provider: Simone Curia, MD  HPI: 42 year old female active smoker( heavy) seen for pulmonary consult May 30, 2022 found to have severe obstructive sleep apnea and severe COPD Medical history significant for DVT and PE around 2020 completed anticoagulation course History of polysubstance abuse -alcohol and marijuana.    TEST/EVENTS :  split night sleep study on February 25, 2023. The study confirmed severe sleep apnea, She was recommended to begin CPAP with O2 . Titration portion of the study showed optimal control on CPAP 15cmH2O with O2 2l/m   August 27, 2022 PFTs showed severe airflow obstruction with reversibility   CT chest September 25, 2022 diffuse groundglass attenuation with micro nodularity in the lung suggestive of smoking-related disease such as respiratory bronchiolitis/RB ILD.  Small unchanged pulmonary nodules measuring 4 mm or less considered a benign etiology  2D echo November 2023 EF 6065%, grade 2 diastolic dysfunction, RV systolic function normal, pulmonary artery systolic pressure normal    07/06/2023 Follow up :COPD and OSA  Patient returns back for a 1 month follow-up visit.  She has severe COPD.  She continues to smoke 1 pack of cigarettes daily.  We discussed smoking cessation.  Last visit she was started on Breztri.  Unfortunately insurance would not cover.  She was changed over to Symbicort.  Feels that her breathing is doing some better. Feels it is helping some but still gets short of breath with minimum activities.  Patient says this has been progressively getting worse over the last year. .  Walk test today in the office shows no significant desaturations on ambulation with room air.  O2 saturations remained above 92% on room air.  Patient denies any chest pain.  Denies any wheezing.  Says she does have a cough  that seems to be more pronounced in the evening hours.  Patient has severe obstructive sleep apnea.  Last visit was recommended to start on CPAP. She is feeling so much better since starting CPAP.  Feels that is really helped her daytime sleepiness.  Download shows excellent compliance with daily average usage at 6 hours.  Patient is on CPAP 15 cm H2O.  AHI 2.2/hour      No Known Allergies  Immunization History  Administered Date(s) Administered   Influenza,inj,Quad PF,6+ Mos 05/23/2022    History reviewed. No pertinent past medical history.  Tobacco History: Social History   Tobacco Use  Smoking Status Every Day   Types: Cigarettes  Smokeless Tobacco Never  Tobacco Comments   10/12 cigs 05/30/22   Ready to quit: No Counseling given: Yes Tobacco comments: 10/12 cigs 05/30/22   Outpatient Medications Prior to Visit  Medication Sig Dispense Refill   albuterol (ACCUNEB) 0.63 MG/3ML nebulizer solution Take 3 mLs (0.63 mg total) by nebulization every 4 (four) hours as needed for wheezing. 75 mL 3   albuterol (VENTOLIN HFA) 108 (90 Base) MCG/ACT inhaler Inhale 1-2 puffs into the lungs every 6 (six) hours as needed for wheezing or shortness of breath.     hydrOXYzine (ATARAX/VISTARIL) 25 MG tablet Take 1 tablet (25 mg total) by mouth 3 (three) times daily as needed for anxiety. 30 tablet 0   lisinopril-hydrochlorothiazide (ZESTORETIC) 20-12.5 MG tablet Take 1 tablet by mouth daily.     metoprolol tartrate (LOPRESSOR) 100 MG tablet Take 1 tablet (100 mg total) by  mouth 2 (two) times daily. 60 tablet 0   SYMBICORT 160-4.5 MCG/ACT inhaler Inhale 2 puffs into the lungs 2 (two) times daily. 10.2 g 12   tiZANidine (ZANAFLEX) 4 MG tablet Take 4 mg by mouth 3 (three) times daily.     VENTOLIN HFA 108 (90 Base) MCG/ACT inhaler Inhale into the lungs.     celecoxib (CELEBREX) 200 MG capsule SMARTSIG:1.0 Capsule(s) By Mouth Daily     gabapentin (NEURONTIN) 100 MG capsule SMARTSIG:1.0 Capsule(s)  By Mouth 3 Times Daily     Multiple Vitamin (MULTIVITAMIN WITH MINERALS) TABS tablet Take 1 tablet by mouth daily. (Patient not taking: Reported on 07/06/2023)     nicotine polacrilex (NICORETTE) 2 MG gum Take 1 each (2 mg total) by mouth as needed for smoking cessation. (Patient not taking: Reported on 07/06/2023) 100 tablet 0   traMADol (ULTRAM) 50 MG tablet SMARTSIG:1.0 Tablet(s) By Mouth 3 Times Daily PRN     No facility-administered medications prior to visit.     Review of Systems:   Constitutional:   No  weight loss, night sweats,  Fevers, chills, +fatigue, or  lassitude.  HEENT:   No headaches,  Difficulty swallowing,  Tooth/dental problems, or  Sore throat,                No sneezing, itching, ear ache, nasal congestion, post nasal drip,   CV:  No chest pain,  Orthopnea, PND, swelling in lower extremities, anasarca, dizziness, palpitations, syncope.   GI  No heartburn, indigestion, abdominal pain, nausea, vomiting, diarrhea, change in bowel habits, loss of appetite, bloody stools.   Resp:   No chest wall deformity  Skin: no rash or lesions.  GU: no dysuria, change in color of urine, no urgency or frequency.  No flank pain, no hematuria   MS:  No joint pain or swelling.  No decreased range of motion.  No back pain.    Physical Exam  BP 134/88 (BP Location: Left Arm, Patient Position: Sitting, Cuff Size: Large)   Pulse 96   Temp 97.9 F (36.6 C) (Oral)   Ht 5\' 4"  (1.626 m)   Wt (!) 424 lb 12.8 oz (192.7 kg)   SpO2 94%   BMI 72.92 kg/m   GEN: A/Ox3; pleasant , NAD, BMI 72   HEENT:  [place]/AT,  NOSE-clear, THROAT-clear, no lesions, no postnasal drip or exudate noted. Class 4 MP airway   NECK:  Supple w/ fair ROM; no JVD; normal carotid impulses w/o bruits; no thyromegaly or nodules palpated; no lymphadenopathy.    RESP  Clear  P & A; w/o, wheezes/ rales/ or rhonchi. no accessory muscle use, no dullness to percussion  CARD:  RRR, no m/r/g, tr  peripheral edema, pulses  intact, no cyanosis or clubbing.  GI:   Soft & nt; nml bowel sounds; no organomegaly or masses detected.   Musco: Warm bil, no deformities or joint swelling noted.   Neuro: alert, no focal deficits noted.    Skin: Warm, no lesions or rashes    Lab Results:    BMET   BNP No results found for: "BNP"  ProBNP No results found for: "PROBNP"  Imaging: No results found.  Administration History     None          Latest Ref Rng & Units 08/27/2022    9:36 AM  PFT Results  FVC-Pre L 1.54   FVC-Predicted Pre % 40   FVC-Post L 1.84   FVC-Predicted Post % 48  Pre FEV1/FVC % % 59   Post FEV1/FCV % % 66   FEV1-Pre L 0.91   FEV1-Predicted Pre % 29   FEV1-Post L 1.22   DLCO uncorrected ml/min/mmHg 11.21   DLCO UNC% % 49   DLCO corrected ml/min/mmHg 11.21   DLCO COR %Predicted % 49   DLVA Predicted % 80   TLC L 7.93   TLC % Predicted % 152   RV % Predicted % 342     No results found for: "NITRICOXIDE"      Assessment & Plan:   COPD (chronic obstructive pulmonary disease) (HCC) Very severe COPD-patient does have some improvement with Symbicort.  Will add in Spiriva for triple therapy.  She is encouraged on smoking cessation.  Check alpha-1 testing today.  Plan  Patient Instructions  Symbicort  2 puffs Twice daily, rinse after use.  Begin Spiriva 2 puffs daily  Albuterol inhaler As needed   Work on quitting smoking   Labs today   CPAP At bedtime with Oxygen , wear all night long  Work on healthy weight loss  Do not drive if sleepy  Use caution with sedating medications   Follow up with Dr. Wynona Neat in 4 months and As needed   Please contact office for sooner follow up if symptoms do not improve or worsen or seek emergency care        Sleep apnea Severe obstructive sleep apnea with excellent control and compliance on CPAP.  She is continue on CPAP at bedtime with oxygen.  Chronic respiratory failure with hypoxia (HCC) Continue on oxygen 2 L at  bedtime with her CPAP.  Walk test today in the office shows no significant desaturations  Shortness of breath Chronic dyspnea questionable etiology.  Check chest x-ray today. Lab work including D-dimer.  Patient has a history of DVT and PE.  Exam is unrevealing.  If D-dimer is positive we will need to order a CT angio with PE protocol. CBC and BMP are also pending. Will add in Spiriva-for triple therapy for severe COPD. Plan  Patient Instructions  Symbicort  2 puffs Twice daily, rinse after use.  Begin Spiriva 2 puffs daily  Albuterol inhaler As needed   Work on quitting smoking   Labs today   CPAP At bedtime with Oxygen , wear all night long  Work on healthy weight loss  Do not drive if sleepy  Use caution with sedating medications   Follow up with Dr. Wynona Neat in 4 months and As needed   Please contact office for sooner follow up if symptoms do not improve or worsen or seek emergency care          Tobacco abuse Smoking cessation discussed    I spent   45 minutes dedicated to the care of this patient on the date of this encounter to include pre-visit review of records, face-to-face time with the patient discussing conditions above, post visit ordering of testing, clinical documentation with the electronic health record, making appropriate referrals as documented, and communicating necessary findings to members of the patients care team.   Rubye Oaks, NP 07/06/2023

## 2023-07-07 ENCOUNTER — Telehealth: Payer: Self-pay

## 2023-07-07 DIAGNOSIS — R7989 Other specified abnormal findings of blood chemistry: Secondary | ICD-10-CM

## 2023-07-07 LAB — BASIC METABOLIC PANEL
BUN: 23 mg/dL (ref 6–23)
CO2: 30 meq/L (ref 19–32)
Calcium: 9.4 mg/dL (ref 8.4–10.5)
Chloride: 98 meq/L (ref 96–112)
Creatinine, Ser: 0.91 mg/dL (ref 0.40–1.20)
GFR: 78.02 mL/min (ref >60.00)
Glucose, Bld: 106 mg/dL — ABNORMAL HIGH (ref 70–99)
Potassium: 4.6 meq/L (ref 3.5–5.1)
Sodium: 137 meq/L (ref 135–145)

## 2023-07-07 LAB — BRAIN NATRIURETIC PEPTIDE: Pro B Natriuretic peptide (BNP): 24 pg/mL (ref 0.0–100.0)

## 2023-07-07 LAB — CBC WITH DIFFERENTIAL/PLATELET
Basophils Absolute: 0.2 10*3/uL — ABNORMAL HIGH (ref 0.0–0.1)
Basophils Relative: 2 % (ref 0.0–3.0)
Eosinophils Absolute: 0.2 10*3/uL (ref 0.0–0.7)
Eosinophils Relative: 2 % (ref 0.0–5.0)
HCT: 50.3 % — ABNORMAL HIGH (ref 36.0–46.0)
Hemoglobin: 16.5 g/dL — ABNORMAL HIGH (ref 12.0–15.0)
Lymphocytes Relative: 21.1 % (ref 12.0–46.0)
Lymphs Abs: 2.1 10*3/uL (ref 0.7–4.0)
MCHC: 32.8 g/dL (ref 30.0–36.0)
MCV: 106.3 fL — ABNORMAL HIGH (ref 78.0–100.0)
Monocytes Absolute: 0.7 10*3/uL (ref 0.1–1.0)
Monocytes Relative: 6.8 % (ref 3.0–12.0)
Neutro Abs: 6.8 10*3/uL (ref 1.4–7.7)
Neutrophils Relative %: 68.1 % (ref 43.0–77.0)
Platelets: 338 10*3/uL (ref 150.0–400.0)
RBC: 4.74 Mil/uL (ref 3.87–5.11)
RDW: 14.2 % (ref 11.5–15.5)
WBC: 9.9 10*3/uL (ref 4.0–10.5)

## 2023-07-07 NOTE — Telephone Encounter (Signed)
I have spoke with the patient and her CT has been scheduled at [facility] 07/07/23 at 4:00pm arrival time 3:30pm. I have gotten the CT approved and faxed the CT order to [facility]

## 2023-07-07 NOTE — Telephone Encounter (Signed)
Just an FYI.  Nothing further needed.

## 2023-07-07 NOTE — Telephone Encounter (Signed)
-----   Message from [facility] sent at 07/07/2023 11:53 AM EST ----- D-dimer is slightly elevated-will need to set up CT angio with PE protocol to be done to rule out PE today .  Please call patient and let her know- Patient denied pregnancy at yesterday visit please verify not pregnant.  Weight is 424lbs .

## 2023-07-07 NOTE — Telephone Encounter (Signed)
I have notified the patient and placed the order.  Synetta Fail please schedule. Thank you!

## 2023-07-14 LAB — D-DIMER, QUANTITATIVE: D-Dimer, Quant: 0.55 ug{FEU}/mL — ABNORMAL HIGH (ref <0.50)

## 2023-07-14 LAB — ALPHA-1 ANTITRYPSIN PHENOTYPE: A-1 Antitrypsin, Ser: 194 mg/dL (ref 83–199)

## 2023-07-17 NOTE — Progress Notes (Signed)
Called and spoke with patient, provided results/recommendations per Tammy Parrett NP.  She verbalized understanding.  Nothing further needed.

## 2023-08-06 ENCOUNTER — Ambulatory Visit: Payer: Medicaid Other | Admitting: Adult Health

## 2023-08-20 ENCOUNTER — Other Ambulatory Visit: Payer: Self-pay | Admitting: Orthopedic Surgery

## 2023-08-20 DIAGNOSIS — M25512 Pain in left shoulder: Secondary | ICD-10-CM

## 2023-08-20 DIAGNOSIS — M7542 Impingement syndrome of left shoulder: Secondary | ICD-10-CM

## 2023-09-11 ENCOUNTER — Telehealth: Payer: Self-pay | Admitting: Adult Health

## 2023-09-11 DIAGNOSIS — G4733 Obstructive sleep apnea (adult) (pediatric): Secondary | ICD-10-CM

## 2023-09-11 NOTE — Telephone Encounter (Signed)
That is fine

## 2023-09-11 NOTE — Telephone Encounter (Signed)
Patient requesting CPAP supplies. She reports she uses simplus full face mask-small. Requesting new DME company. Please advise.

## 2023-09-11 NOTE — Telephone Encounter (Signed)
Lmtcb

## 2023-09-11 NOTE — Telephone Encounter (Signed)
Patient is switching from [facility] to [facility] and they are needing a new order. Please advise.

## 2023-09-14 NOTE — Telephone Encounter (Signed)
 Order placed to [facility]. Nothing further needed.

## 2023-10-07 NOTE — Telephone Encounter (Signed)
 Patient has came into the office since 07/06/2023 will close this encounter.

## 2024-01-06 ENCOUNTER — Other Ambulatory Visit: Payer: Self-pay

## 2024-01-06 DIAGNOSIS — M47816 Spondylosis without myelopathy or radiculopathy, lumbar region: Secondary | ICD-10-CM

## 2024-01-08 ENCOUNTER — Ambulatory Visit
Admission: RE | Admit: 2024-01-08 | Discharge: 2024-01-08 | Disposition: A | Discharge status: Home / Self Care | Source: Ambulatory Visit

## 2024-01-08 DIAGNOSIS — M47816 Spondylosis without myelopathy or radiculopathy, lumbar region: Secondary | ICD-10-CM

## 2024-03-03 ENCOUNTER — Other Ambulatory Visit: Payer: Self-pay

## 2024-03-03 ENCOUNTER — Encounter: Payer: Self-pay | Admitting: Neurology

## 2024-03-03 DIAGNOSIS — R202 Paresthesia of skin: Secondary | ICD-10-CM

## 2024-04-08 ENCOUNTER — Encounter: Payer: Self-pay | Admitting: Neurology

## 2024-04-08 ENCOUNTER — Ambulatory Visit (INDEPENDENT_AMBULATORY_CARE_PROVIDER_SITE_OTHER): Admitting: Neurology

## 2024-04-08 DIAGNOSIS — R202 Paresthesia of skin: Secondary | ICD-10-CM

## 2024-04-08 NOTE — Progress Notes (Signed)
 [facility] Clinic Note - Initial Visit   Date: 04/08/2024   Stacey Hunter MRN: [id number] DOB: 26-Aug-1980   Dear Dr. Tonnie:  Thank you for your kind referral of Stacey Hunter for consultation of bilateral arm tingling. Although her history is well known to you, please allow us  to reiterate it for the purpose of our medical record. The patient was accompanied to the clinic by daughter who also provides collateral information.     Stacey Hunter is a 43 y.o. right-handed female with chronic pain, hypertension, tobacco use, and morbid obesity presenting for evaluation of bilateral upper extremity tingling.   IMPRESSION/PLAN: Bilateral hand paresthesias, ?ulnar neuropathy.  She has completed NCS/EMG with her pain management provider's office and is scheduled to see them later this month to discuss findings.    Bilateral leg numbness.  Reassured patient that symptoms are not due to neuropathy.   She has preserved distal sensation, strength, and reflexes. She may have transient compression of the sciatic nerve when sitting causing her entire leg to go numb.  MRI lumbar spine does not show any nerve impingement.  Discussed that NCS/EMG of the legs would be technically very challenging given her body habitus and would likely yield false results.    ------------------------------------------------------------- History of present illness: For the past year, she reports that both her arms from the elbow down will tingling and fall asleep.  It occurs several times per day and can occur at rest or when doing activity.  Symptoms do not wake her up from sleeping. She has weakness and tends to drop things.  She had nerve testing by her pain management provider, but does not have these results.  She also has numbness involving both legs, from thighs down into the feet.  MRI lumbar spine from June did not show any nerve impingement. She is not working.    Out-side paper records, electronic medical record, and images have been reviewed where available and summarized as:  MRI lumbar spine 01/17/2024: Mild to moderate degenerative disc disease L4-5. Mild discogenic endplate marrow edema. Suggestion of a transitional vertebral level and if surgery is ever considered, please correlate with x-rays for accurate levels. Multilevel degenerative spondylosis levels described in detail above. No disc protrusion or evidence of impingement.  Lab Results  Component Value Date   HGBA1C 5.0 01/13/2019   No results found for: VITAMINB12 Lab Results  Component Value Date   TSH 5.491 (H) 01/13/2019   No results found for: ESRSEDRATE, POCTSEDRATE  History reviewed. No pertinent past medical history.  History reviewed. No pertinent surgical history.   Medications:  Outpatient Encounter Medications as of 04/08/2024  Medication Sig   albuterol  (ACCUNEB ) 0.63 MG/3ML nebulizer solution Take 3 mLs (0.63 mg total) by nebulization every 4 (four) hours as needed for wheezing.   albuterol  (VENTOLIN  HFA) 108 (90 Base) MCG/ACT inhaler Inhale 1-2 puffs into the lungs every 6 (six) hours as needed for wheezing or shortness of breath.   baclofen (LIORESAL) 10 MG tablet Take 10 mg by mouth.   celecoxib (CELEBREX) 200 MG capsule SMARTSIG:1.0 Capsule(s) By Mouth Daily   DULoxetine (CYMBALTA) 60 MG capsule Take 60 mg by mouth daily.   gabapentin (NEURONTIN) 100 MG capsule SMARTSIG:1.0 Capsule(s) By Mouth 3 Times Daily (Patient taking differently: 300 mg QID.)   HYDROcodone-acetaminophen  (NORCO/VICODIN) 5-325 MG tablet Take 1 tablet by mouth every 6 (six) hours as needed.   hydrOXYzine  (ATARAX /VISTARIL ) 25 MG tablet Take 1 tablet (25 mg total) by  mouth 3 (three) times daily as needed for anxiety.   losartan (COZAAR) 25 MG tablet 1 tablet Orally Once a day   metoprolol  tartrate (LOPRESSOR ) 100 MG tablet Take 1 tablet (100 mg total) by mouth 2 (two) times daily.    SYMBICORT  160-4.5 MCG/ACT inhaler Inhale 2 puffs into the lungs 2 (two) times daily.   TRELEGY ELLIPTA 200-62.5-25 MCG/ACT AEPB Inhale 1 puff into the lungs daily.   VENTOLIN  HFA 108 (90 Base) MCG/ACT inhaler Inhale into the lungs.   Vitamin D, Ergocalciferol, (DRISDOL) 1.25 MG (50000 UNIT) CAPS capsule Take 50,000 Units by mouth.   lisinopril-hydrochlorothiazide (ZESTORETIC) 20-12.5 MG tablet Take 1 tablet by mouth daily. (Patient not taking: Reported on 04/08/2024)   Multiple Vitamin (MULTIVITAMIN WITH MINERALS) TABS tablet Take 1 tablet by mouth daily. (Patient not taking: Reported on 04/08/2024)   nicotine  polacrilex (NICORETTE ) 2 MG gum Take 1 each (2 mg total) by mouth as needed for smoking cessation. (Patient not taking: Reported on 04/08/2024)   Tiotropium Bromide Monohydrate  (SPIRIVA  RESPIMAT) 2.5 MCG/ACT AERS Inhale 2 puffs into the lungs daily. (Patient not taking: Reported on 04/08/2024)   Tiotropium Bromide Monohydrate  (SPIRIVA  RESPIMAT) 2.5 MCG/ACT AERS Inhale 2 puffs into the lungs daily. (Patient not taking: Reported on 04/08/2024)   tiZANidine (ZANAFLEX) 4 MG tablet Take 4 mg by mouth 3 (three) times daily. (Patient not taking: Reported on 04/08/2024)   traMADol (ULTRAM) 50 MG tablet SMARTSIG:1.0 Tablet(s) By Mouth 3 Times Daily PRN (Patient not taking: Reported on 04/08/2024)   No facility-administered encounter medications on file as of 04/08/2024.    Allergies: No Known Allergies  Family History: History reviewed. No pertinent family history.  Social History: Social History   Tobacco Use   Smoking status: Every Day    Types: Cigarettes   Smokeless tobacco: Never   Tobacco comments:    10/12 cigs 05/30/22  Substance Use Topics   Alcohol use: Not Currently    Comment: daily    Drug use: Not Currently    Types: Marijuana   Social History   Social History Narrative   Are you right handed or left handed? Right handed   Are you currently employed ? No    What is your  current occupation?   Do you live at home alone? No    Who lives with you? Daughter and significant other    What type of home do you live in: 1 story or 2 story? Lives in a one story home         Vital Signs:  BP (!) 148/91   Pulse 73   Ht 5' 4 (1.626 m)   Wt (!) 386 lb (175.1 kg)   SpO2 98%   BMI 66.26 kg/m   Neurological Exam: MENTAL STATUS including orientation to time, place, person, recent and remote memory, attention span and concentration, language, and fund of knowledge is normal.  Speech is not dysarthric.  CRANIAL NERVES: II:  No visual field defects.     III-IV-VI: Pupils equal round and reactive to light.  Normal conjugate, extra-ocular eye movements in all directions of gaze.  No nystagmus.  No ptosis.   V:  Normal facial sensation.    VII:  Normal facial symmetry and movements.   VIII:  Normal hearing and vestibular function.   IX-X:  Normal palatal movement.   XI:  Normal shoulder shrug and head rotation.   XII:  Normal tongue strength and range of motion, no deviation or fasciculation.  MOTOR:  Motor strength is 5/5 throughout, including distally in the hands and feet. No atrophy, fasciculations or abnormal movements.  No pronator drift.   MSRs:                                           Right        Left brachioradialis 2+  2+  biceps 2+  2+  triceps 2+  2+  patellar 2+  2+  ankle jerk 2+  2+  Hoffman no  no  plantar response down  down   SENSORY:  Normal and symmetric perception of light touch, pinprick, vibration, and temperature in the hands and feet.  Subjective loss of pin prick and temperature in the lower legs and forearms.    COORDINATION/GAIT: Normal finger-to- nose-finger.  Intact rapid alternating movements bilaterally.  Gait is wide-based due to body habitus, stable, unassisted.     Thank you for allowing me to participate in patient's care.  If I can answer any additional questions, I would be pleased to do so.     Sincerely,    Milika Ventress K. Tobie, DO
